# Patient Record
Sex: Male | Born: 1985
Health system: Southern US, Community
[De-identification: ages and names within clinical notes are randomized; demographics above are authoritative.]

## PROBLEM LIST (undated history)

## (undated) DIAGNOSIS — K921 Melena: Secondary | ICD-10-CM

## (undated) DIAGNOSIS — E785 Hyperlipidemia, unspecified: Secondary | ICD-10-CM

## (undated) HISTORY — DX: Hyperlipidemia, unspecified: E78.5

## (undated) HISTORY — DX: Melena: K92.1

## (undated) HISTORY — PX: WISDOM TOOTH EXTRACTION: SHX21

---

## 2007-07-04 ENCOUNTER — Encounter: Admission: RE | Admit: 2007-07-04 | Discharge: 2007-07-04 | Payer: Self-pay | Admitting: Internal Medicine

## 2008-11-08 ENCOUNTER — Emergency Department (HOSPITAL_COMMUNITY): Admission: EM | Admit: 2008-11-08 | Discharge: 2008-11-08 | Payer: Self-pay | Admitting: Family Medicine

## 2009-02-09 IMAGING — RF DG UGI W/ HIGH DENSITY W/KUB
11 of 14 series · 18 of 24 positions shown · non-contrast
Comparison: None.

CLINICAL DATA: Heartburn.  Bright red blood in stools.  Constipation.  Chest pain. 
 UPPER GI WITH HIGH DENSITY WITH KUB:

[Series 1: run · 1 of 1 slices shown (1 of 10)]
[im 1/1]
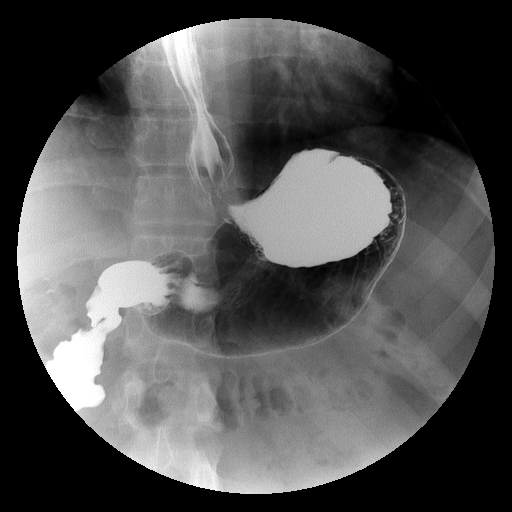

[Series 3: run · 4 of 12 slices shown (2 of 10)]
[im 1/12]
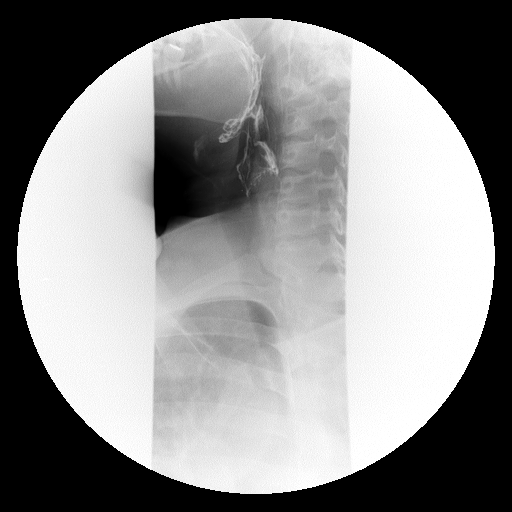
[im 3/12]
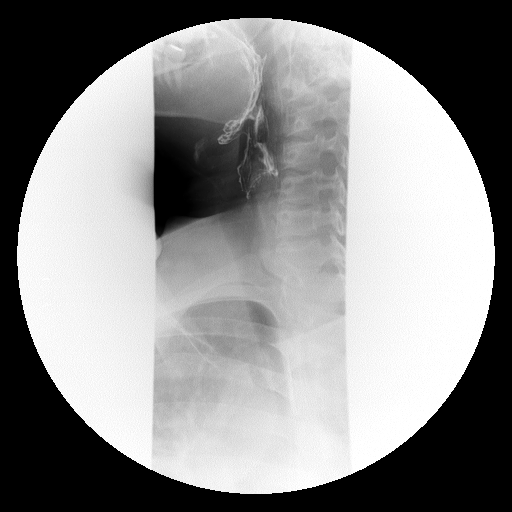
[im 6/12]
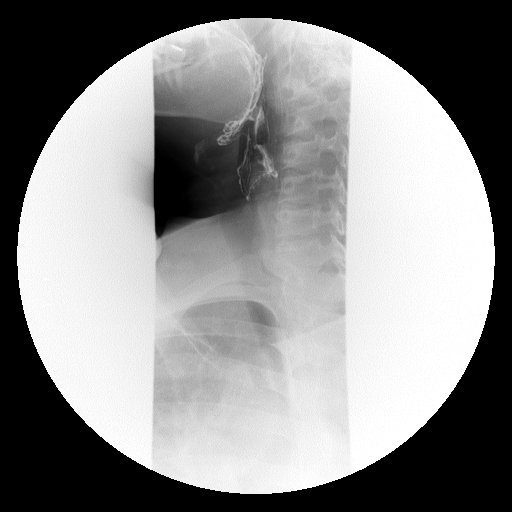
[im 12/12]
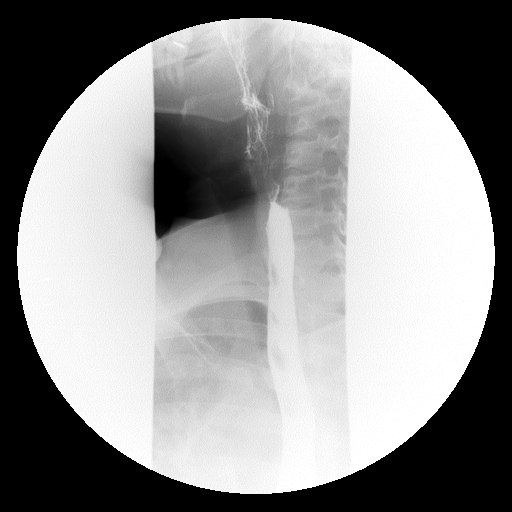

[Series 4: run · 1 of 3 slices shown (3 of 10)]
[im 1/3]
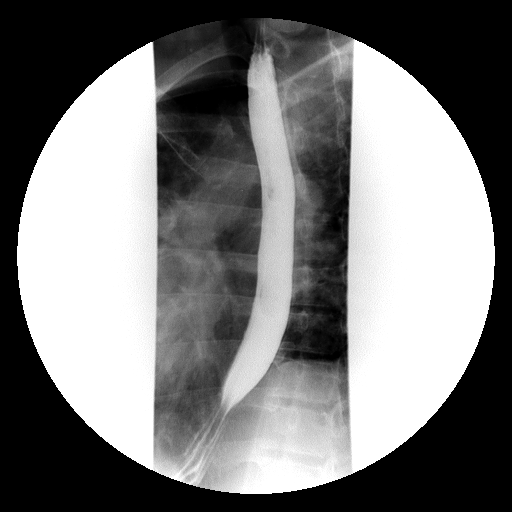

[Series 5: run · 5 of 15 slices shown (4 of 10)]
[im 1/15]
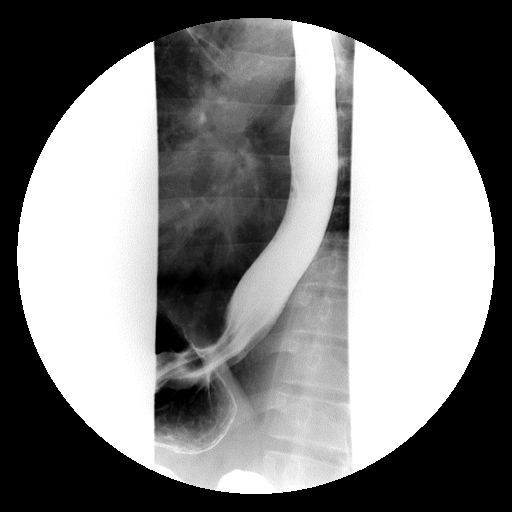
[im 5/15]
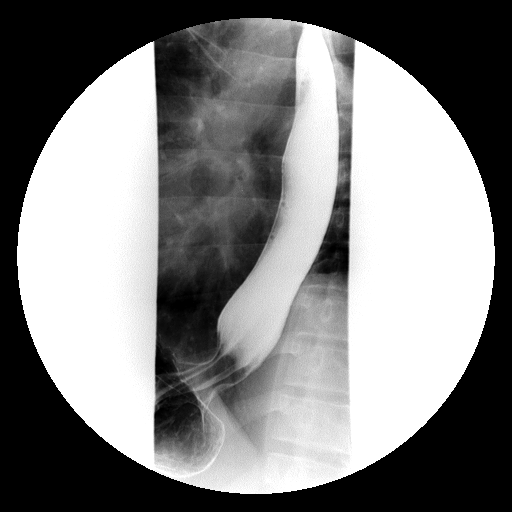
[im 8/15]
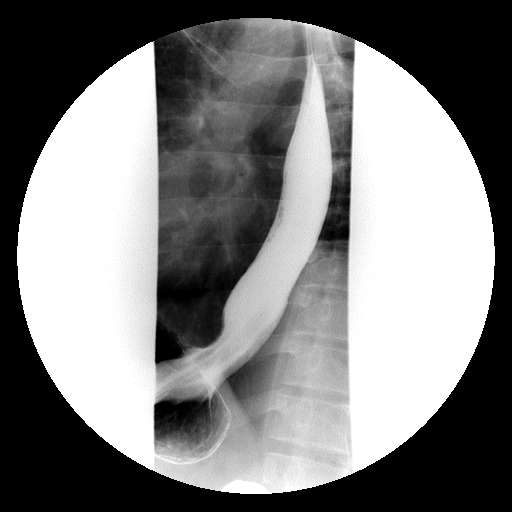
[im 10/15]
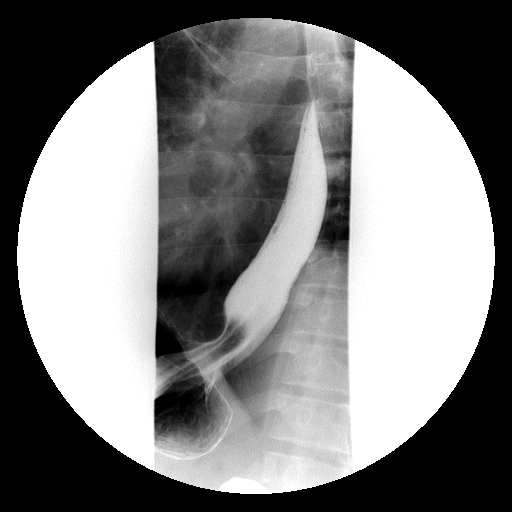
[im 15/15]
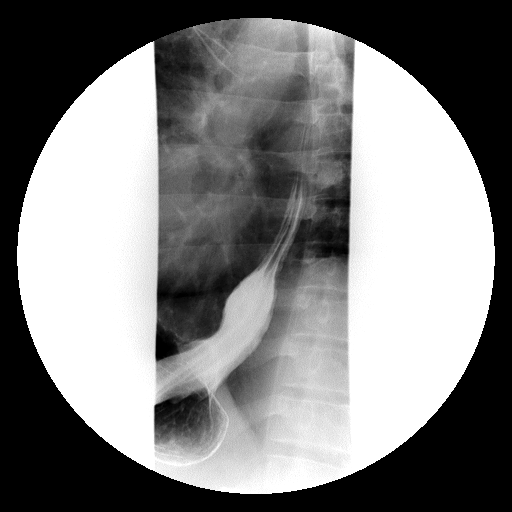

[Series 6: run · 1 of 1 slices shown (5 of 10)]
[im 1/1]
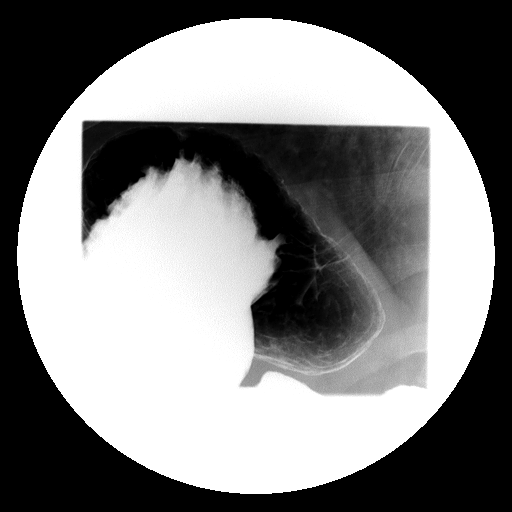

[Series 7: run · 1 of 1 slices shown (6 of 10)]
[im 1/1]
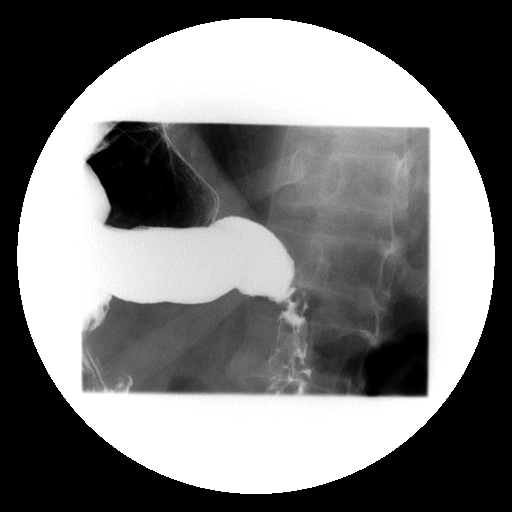

[Series 9: run · 1 of 1 slices shown (7 of 10)]
[im 1/1]
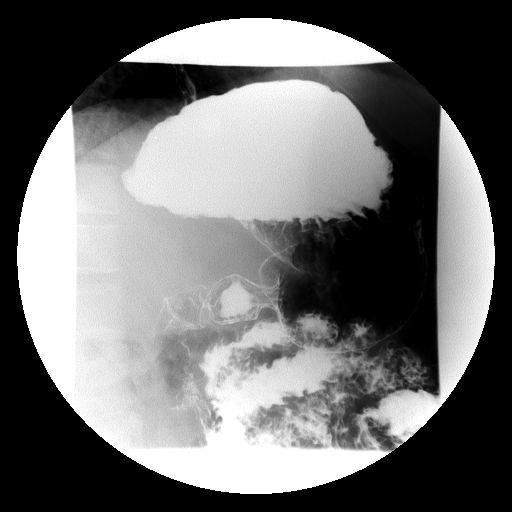

[Series 10: run · 1 of 1 slices shown (8 of 10)]
[im 1/1]
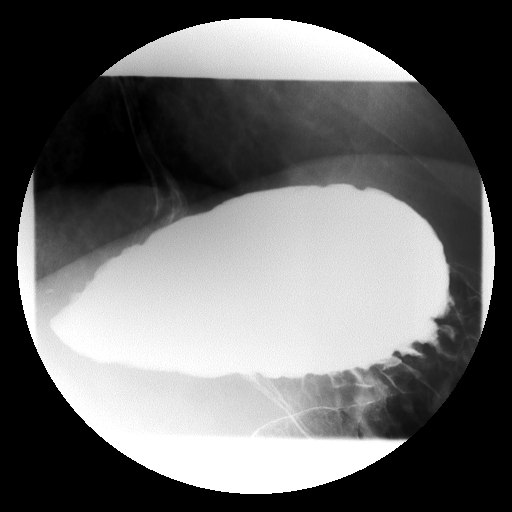

[Series 11: run · 1 of 1 slices shown (9 of 10)]
[im 1/1]
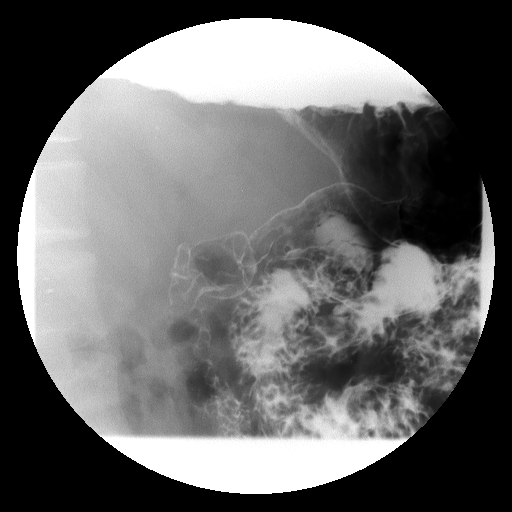

[Series 13: run · 1 of 1 slices shown (10 of 10)]
[im 1/1]
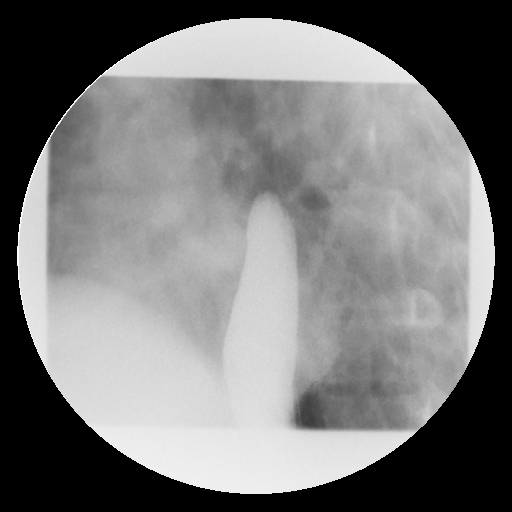

[Series 1001: view not recorded · 0.20mm/px · 1 of 1 slices shown]
[im 1/1]
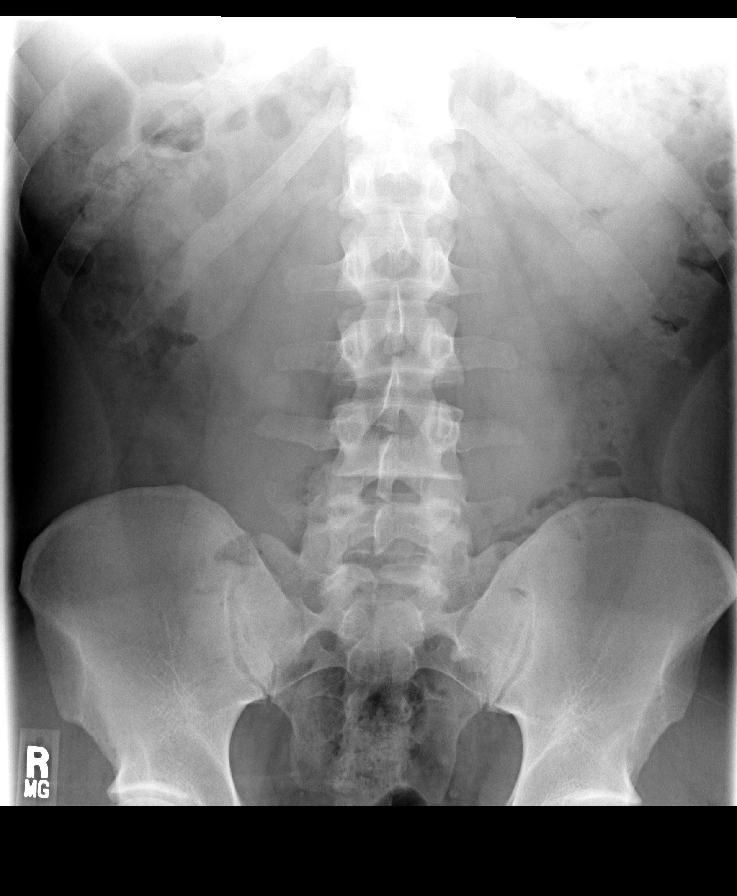

[18 of 24 positions shown; findings below may reference images not displayed]

FINDINGS: Normal antegrade peristalsis is seen through the cervical thoracic esophagus with very small sliding type hiatus hernia.  Distal esophageal longitudinal folds are thickened consistent with mild esophagitis without significant spasm.  Minimal spontaneous and slight water siphon induced gastroesophageal reflux was demonstrated.  No other intrinsic nor extrinsic esophageal lesions are seen.  Stomach, duodenum, and proximal loops of jejunum appear normal.
IMPRESSION: 1.  Minimal spontaneous and slight water siphon induced gastroesophageal reflux. 
 2.  Very small sliding type hiatus hernia with mild distal esophagitis findings.  
 3.  Otherwise negative.

## 2010-12-07 LAB — POCT URINALYSIS DIP (DEVICE)
Glucose, UA: NEGATIVE mg/dL
Hgb urine dipstick: NEGATIVE
Ketones, ur: NEGATIVE mg/dL
Nitrite: NEGATIVE
Protein, ur: NEGATIVE mg/dL
Specific Gravity, Urine: 1.02 (ref 1.005–1.030)

## 2010-12-07 LAB — POCT I-STAT, CHEM 8
BUN: 16 mg/dL (ref 6–23)
Calcium, Ion: 1.06 mmol/L — ABNORMAL LOW (ref 1.12–1.32)
Chloride: 104 mEq/L (ref 96–112)
Creatinine, Ser: 1 mg/dL (ref 0.4–1.5)
HCT: 50 % (ref 39.0–52.0)
Hemoglobin: 17 g/dL (ref 13.0–17.0)
Potassium: 3.9 mEq/L (ref 3.5–5.1)
Sodium: 140 mEq/L (ref 135–145)

## 2017-01-06 DIAGNOSIS — N453 Epididymo-orchitis: Secondary | ICD-10-CM | POA: Diagnosis not present

## 2017-03-29 LAB — HIV ANTIBODY (ROUTINE TESTING W REFLEX): HIV 1&2 Ab, 4th Generation: NEGATIVE

## 2017-03-29 LAB — HEMOGLOBIN A1C: HEMOGLOBIN A1C: 4.5

## 2017-03-29 LAB — BASIC METABOLIC PANEL: Glucose: 74

## 2017-04-04 ENCOUNTER — Encounter: Payer: Self-pay | Admitting: Family Medicine

## 2017-04-04 ENCOUNTER — Ambulatory Visit (INDEPENDENT_AMBULATORY_CARE_PROVIDER_SITE_OTHER): Payer: 59 | Admitting: Family Medicine

## 2017-04-04 VITALS — BP 104/76 | HR 87 | Temp 98.5°F | Ht 70.25 in | Wt 230.4 lb

## 2017-04-04 DIAGNOSIS — Z0001 Encounter for general adult medical examination with abnormal findings: Secondary | ICD-10-CM | POA: Diagnosis not present

## 2017-04-04 DIAGNOSIS — K921 Melena: Secondary | ICD-10-CM

## 2017-04-04 DIAGNOSIS — Z23 Encounter for immunization: Secondary | ICD-10-CM

## 2017-04-04 NOTE — Addendum Note (Signed)
Addended by: Rivka SaferSOUTHERN HIZER, Asher MuirJAMIE M on: 04/04/2017 02:13 PM   Modules accepted: Orders

## 2017-04-04 NOTE — Addendum Note (Signed)
Addended by: Nehemiah MassedAGBO, Jodeci Roarty D on: 04/04/2017 02:16 PM   Modules accepted: Orders

## 2017-04-04 NOTE — Progress Notes (Signed)
Phone: [phone]  Subjective:  Patient presents today to establish care.  Prior patient about 10 years ago in [place]. Chief complaint-noted.   See problem oriented charting  The following were reviewed and entered/updated in epic: Past Medical History:  Diagnosis Date  . Blood in stool    a few times a year. never evaluated.    Patient Active Problem List   Diagnosis Date Noted  . Blood in stool    Past Surgical History:  Procedure Laterality Date  . WISDOM TOOTH EXTRACTION     had anesthesia    Family History  Problem Relation Age of Onset  . Healthy Mother   . Depression Father        on rx  . Healthy Sister   . Healthy Brother   . Breast cancer Maternal Grandmother        late 50s  . Stroke Maternal Grandfather        died 67, smoker earlier in life  . Other Paternal Grandfather        hit by car    Medications- reviewed and updated No current outpatient prescriptions on file.   No current facility-administered medications for this visit.     Allergies-reviewed and updated Allergies  Allergen Reactions  . Penicillins Hives    Social History   Social History  . Marital status: Married    Spouse name: N/A  . Number of children: N/A  . Years of education: N/A   Social History Main Topics  . Smoking status: Never Smoker  . Smokeless tobacco: Never Used  . Alcohol use 3.0 oz/week    5 Standard drinks or equivalent per week  . Drug use: No  . Sexual activity: Yes   Other Topics Concern  . None   Social History Narrative   Married to Moshe Cipro, PT ([facility]) since 8072. 22 year old son- Monia Pouch - in 2018.       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    Robina Ade Pricilla Handler- studied Primary school teacher      Hobbies: football fan ([employer], some [facility] games), does some fantasy football, family time    ROS--Full ROS was  completed Review of Systems  Constitutional: Negative for chills and fever.  HENT: Negative for hearing loss and tinnitus.   Eyes: Negative for blurred vision and double vision.  Respiratory: Negative for cough and hemoptysis.   Cardiovascular: Negative for chest pain and palpitations.  Gastrointestinal: Negative for heartburn and nausea.  Genitourinary: Negative for dysuria and urgency.  Musculoskeletal: Positive for myalgias (left groin pain at times in AM). Negative for back pain and joint pain.  Skin: Negative for itching and rash.  Neurological: Negative for dizziness and headaches.  Endo/Heme/Allergies: Negative for polydipsia. Does not bruise/bleed easily.  Psychiatric/Behavioral: Negative for hallucinations and substance abuse.   Objective: BP 104/76 (BP Location: Left Arm, Patient Position: Sitting, Cuff Size: Large)   Pulse 87   Temp 98.5 F (36.9 C) (Oral)   Ht 5' 10.25" (1.784 m)   Wt 230 lb 6.4 oz (104.5 kg)   SpO2 95%   BMI 32.82 kg/m  Gen: NAD, resting comfortably HEENT: Mucous membranes are moist. Oropharynx normal. TM normal. Eyes: sclera and lids normal, PERRLA Neck: no thyromegaly, no cervical lymphadenopathy CV: RRR no murmurs rubs or gallops Lungs: CTAB no crackles, wheeze, rhonchi Abdomen: soft/nontender/nondistended/normal bowel sounds. No rebound or guarding.  Ext: no edema Skin:  warm, dry Neuro: 5/5 strength in upper and lower extremities, normal gait, normal reflexes Male genitalia: normal, penis: no lesions or discharge. testes: no masses or tenderness. no hernias, normal findings: scrotal contents normal to inspection and palpation, normal testes palpated bilaterally and no hernia detected  Assessment/Plan:  31 y.o. male presenting for annual physical.  Health Maintenance counseling: 1. Anticipatory guidance: Patient counseled regarding regular dental exams-q6 months, eye exams - annually- no issues, wearing seatbelts.  2. Risk factor reduction:   Advised patient of need for regular exercise and diet rich and fruits and vegetables to reduce risk of heart attack and stroke. Exercise- 0-1 x a week- advised 150 minutes a week. Diet-needs to cut down on carbs/pizza type foods/fried foods- twice a week eats out. Discussed goals 200 or so with hs 180-185.  Wt Readings from Last 3 Encounters:  04/04/17 230 lb 6.4 oz (104.5 kg)  3. Immunizations/screenings/ancillary studies- advised Tdap - given today. Flu shot in fall recommended.  4. Prostate cancer screening- no family history, start at age 33-55   5. Colon cancer screening - no family history, start at age 33-50. Will do stool cards due to rectal bleeding 6. Skin cancer screening/prevention- wears sunscreen regularly. No worrisome skin lesions- gets some small pimple like areas on arms that come and go 7. Testicular cancer screening- advised monthly self exams. Also see concerns below 8. STD screening- patient opts out- monogamous   Status of chronic or acute concerns   Waiting on semen analysis- trying for another child 6 months. This worries him in relation to next issue--> Tension and soreness in groin. Worse when spreads legs in morning. Often in the morning and in the left side - not in the testicle itself- but in left low groin. Plays softball- strained during softball- but not sure that caused it. 2 months ago testicle itself felt swollen on a long car ride to the beach. Was seen at urgent care next day- was placed on antibiotic and better within 24 hours. No recurrence  For a few years has had intermittent blood in the stools a few times a year. Will get stool cards. No family history colon cancer or polyps- if this developed would refer for colonoscopy. Would also refer if stool cards positive  Tdap given  Labs from work reviewed- no concerns other than LDL 114 (likely can improve that with exercise)  Orders Placed This Encounter  Procedures  . POC Hemoccult Bld/Stl (3-Cd Home  Screen)    Send home    Standing Status:   Future    Standing Expiration Date:   04/04/2018   Return precautions advised.  Tana ConchStephen Jayton Popelka, MD

## 2017-04-04 NOTE — Patient Instructions (Addendum)
Tdap today  I do not see any hernias in the groin and testicle and scrotum contents are completely normal. Based on your pain happening with position changes suspects musculoskeletal cause- I would have your wife take a look at your hip/groin- and could also refer to Dr. Berline Choughigby if needed.   I would love to see you exercising 150 minutes a week, getting 3-5 servings of veggies in a day, cutting down on fried foods and carbs and working on getting weight down to 200 lbs. This should help get bad cholesterol down under 100 (114 on labs you brought in- thanks for bringing these!)   Stop by lab to get stool cards and send those back/drop them off for us. If blood in stool or you end up finding about family history of colon cancer or polyps would move forward with GI referral and colonoscopy

## 2017-04-05 ENCOUNTER — Encounter: Payer: Self-pay | Admitting: Family Medicine

## 2019-03-04 ENCOUNTER — Ambulatory Visit (INDEPENDENT_AMBULATORY_CARE_PROVIDER_SITE_OTHER): Payer: 59 | Admitting: Physician Assistant

## 2019-03-04 ENCOUNTER — Encounter: Payer: Self-pay | Admitting: Physician Assistant

## 2019-03-04 ENCOUNTER — Telehealth: Payer: Self-pay | Admitting: Physician Assistant

## 2019-03-04 ENCOUNTER — Ambulatory Visit: Payer: Self-pay | Admitting: *Deleted

## 2019-03-04 DIAGNOSIS — R05 Cough: Secondary | ICD-10-CM

## 2019-03-04 DIAGNOSIS — Z20828 Contact with and (suspected) exposure to other viral communicable diseases: Secondary | ICD-10-CM

## 2019-03-04 DIAGNOSIS — Z7189 Other specified counseling: Secondary | ICD-10-CM

## 2019-03-04 DIAGNOSIS — R059 Cough, unspecified: Secondary | ICD-10-CM

## 2019-03-04 NOTE — Telephone Encounter (Signed)
Pt called in to schedule Covid-19 testing. Pt requests call back. Cb# [phone]

## 2019-03-04 NOTE — Telephone Encounter (Signed)
Please call patient and schedule COVID-19 testing at your earliest convenience.  Thank you, Lasheika Ortloff PA-C  

## 2019-03-04 NOTE — Telephone Encounter (Signed)
Pt call c/o sore throat and cough this morning. He would like to be tested for covid-19. Advised that he would need a virtual appointment first, he voiced understanding. He denies fever, shortness of breath, body aches or loss of smell and taste.  And he has not been around of anyone that has tested positive for the virus that he is aware of. [facility] notified regarding an appointment. Call transferred over to them. Triage encounter routed to the office for review. Reason for Disposition . [1] COVID-19 infection suspected by caller or triager AND [2] mild symptoms (cough, fever, or others) AND [3] no complications or SOB  Answer Assessment - Initial Assessment Questions 1. COVID-19 DIAGNOSIS: "Who made your Coronavirus (COVID-19) diagnosis?" "Was it confirmed by a positive lab test?" If not diagnosed by a HCP, ask "Are there lots of cases (community spread) where you live?" (See public health department website, if unsure)     In the community 2. ONSET: "When did the COVID-19 symptoms start?"      today 3. WORST SYMPTOM: "What is your worst symptom?" (e.g., cough, fever, shortness of breath, muscle aches)     Sore throat 4. COUGH: "Do you have a cough?" If so, ask: "How bad is the cough?"       yes 5. FEVER: "Do you have a fever?" If so, ask: "What is your temperature, how was it measured, and when did it start?"     no 6. RESPIRATORY STATUS: "Describe your breathing?" (e.g., shortness of breath, wheezing, unable to speak)      no 7. BETTER-SAME-WORSE: "Are you getting better, staying the same or getting worse compared to yesterday?"  If getting worse, ask, "In what way?"     Worst today 8. HIGH RISK DISEASE: "Do you have any chronic medical problems?" (e.g., asthma, heart or lung disease, weak immune system, etc.)     no 9. PREGNANCY: "Is there any chance you are pregnant?" "When was your last menstrual period?"     no 10. OTHER SYMPTOMS: "Do you have any other symptoms?"   (e.g., chills, fatigue, headache, loss of smell or taste, muscle pain, sore throat)       Sore throat  Protocols used: CORONAVIRUS (COVID-19) DIAGNOSED OR SUSPECTED-A-AH

## 2019-03-04 NOTE — Progress Notes (Signed)
Virtual Visit via Video   I connected with Spencer Phillips on 03/04/19 at  3:40 PM EDT by a video enabled telemedicine application and verified that I am speaking with the correct person using two identifiers. Location patient: Home Location provider: [facility], Office Persons participating in the virtual visit: Spencer Phillips, Jarold MottoSamantha Muhamed Luecke PA-C.  I discussed the limitations of evaluation and management by telemedicine and the availability of in person appointments. The patient expressed understanding and agreed to proceed.  I acted as a Neurosurgeonscribe for Energy East CorporationSamantha Kariel Skillman, Avon ProductsPA-C Donna Orphanos, LPN  Subjective:   HPI:   Cough Pt cough x 2 days, expectorating clear/yellow sputum. Also sore throat since last night. Denies, fever, body aches or diarrhea, no headaches. Pt took Claritin today and has been using throat lozenges with some relief. Pt has not been exposed to COVID-19 that he is aware of. Wife works in Teacher, musichealthcare.   ROS: See pertinent positives and negatives per HPI.  Patient Active Problem List   Diagnosis Date Noted  . Blood in stool     Social History   Tobacco Use  . Smoking status: Never Smoker  . Smokeless tobacco: Never Used  Substance Use Topics  . Alcohol use: Yes    Alcohol/week: 5.0 standard drinks    Types: 5 Standard drinks or equivalent per week   No current outpatient medications on file.  Allergies  Allergen Reactions  . Penicillins Hives    Objective:   VITALS: Per patient if applicable, see vitals. GENERAL: Alert, appears well and in no acute distress. HEENT: Atraumatic, conjunctiva clear, no obvious abnormalities on inspection of external nose and ears. NECK: Normal movements of the head and neck. CARDIOPULMONARY: No increased WOB. Speaking in clear sentences. I:E ratio WNL.  MS: Moves all visible extremities without noticeable abnormality. PSYCH: Pleasant and cooperative, well-groomed. Speech normal rate and rhythm.  Affect is appropriate. Insight and judgement are appropriate. Attention is focused, linear, and appropriate.  NEURO: CN grossly intact. Oriented as arrived to appointment on time with no prompting. Moves both UE equally.  SKIN: No obvious lesions, wounds, erythema, or cyanosis noted on face or hands.  Assessment and Plan:   Spencer Phillips was seen today for cough and sore throat.  Diagnoses and all orders for this visit:  Cough  Advice Given About Covid-19 Virus Infection   Patient has a respiratory illness without signs of acute distress or respiratory compromise at this time. This is likely a viral infection, which can come from a number of respiratory viruses. We are going to send patient for drive-up testing. As a precaution, they have been advised to remain home until COVID-19 results and then possible further quarantine after that based on results and symptoms. Advised if they experience a "second sickening" or worsening symptoms as the illness progresses, they are to call the office for further instructions or seek emergent evaluation for any severe symptoms.    . Reviewed expectations re: course of current medical issues. . Discussed self-management of symptoms. . Outlined signs and symptoms indicating need for more acute intervention. . Patient verbalized understanding and all questions were answered. Marland Kitchen. Health Maintenance issues including appropriate healthy diet, exercise, and smoking avoidance were discussed with patient. . See orders for this visit as documented in the electronic medical record.  I discussed the assessment and treatment plan with the patient. The patient was provided an opportunity to ask questions and all were answered. The patient agreed with the plan and demonstrated an understanding  of the instructions.   The patient was advised to call back or seek an in-person evaluation if the symptoms worsen or if the condition fails to improve as anticipated.   CMA or LPN  served as scribe during this visit. History, Physical, and Plan performed by medical provider. The above documentation has been reviewed and is accurate and complete.   [place], [place] 03/04/2019

## 2019-03-05 ENCOUNTER — Other Ambulatory Visit: Payer: Self-pay | Admitting: Internal Medicine

## 2019-03-05 ENCOUNTER — Other Ambulatory Visit: Payer: Self-pay

## 2019-03-05 DIAGNOSIS — Z20828 Contact with and (suspected) exposure to other viral communicable diseases: Secondary | ICD-10-CM

## 2019-03-05 DIAGNOSIS — Z20822 Contact with and (suspected) exposure to covid-19: Secondary | ICD-10-CM

## 2019-03-05 NOTE — Telephone Encounter (Signed)
Called pt and he states after not hearing anything, his wife who works for [employer] informed him to just go to [facility] testing site. Pt went around 8:15 this morning and was tested. He had no additional questions at this time. Nothing further is needed.

## 2019-03-09 LAB — NOVEL CORONAVIRUS, NAA: SARS-CoV-2, NAA: NOT DETECTED

## 2019-03-10 ENCOUNTER — Encounter: Payer: Self-pay | Admitting: Physician Assistant

## 2019-11-02 ENCOUNTER — Ambulatory Visit: Payer: Commercial Managed Care - PPO | Attending: Internal Medicine

## 2019-11-02 DIAGNOSIS — Z23 Encounter for immunization: Secondary | ICD-10-CM | POA: Insufficient documentation

## 2019-11-02 NOTE — Progress Notes (Signed)
   Covid-19 Vaccination Clinic  Name:  Spencer Phillips    MRN: [id number] DOB: Oct 22, 1985  11/02/2019  Mr. Spencer Phillips was observed post Covid-19 immunization for 15 minutes without incident. He was provided with Vaccine Information Sheet and instruction to access the V-Safe system.   Mr. Spencer Phillips was instructed to call 911 with any severe reactions post vaccine: Marland Kitchen Difficulty breathing  . Swelling of face and throat  . A fast heartbeat  . A bad rash all over body  . Dizziness and weakness   Immunizations Administered    Name Date Dose VIS Date Route   Pfizer COVID-19 Vaccine 11/02/2019 12:24 PM 0.3 mL 08/07/2019 Intramuscular   Manufacturer: [employer], [employer]   Lot: [id number]   NDC: [address]-4

## 2019-11-30 ENCOUNTER — Ambulatory Visit: Payer: Commercial Managed Care - PPO | Attending: Internal Medicine

## 2019-11-30 DIAGNOSIS — Z23 Encounter for immunization: Secondary | ICD-10-CM

## 2019-11-30 NOTE — Progress Notes (Signed)
   Covid-19 Vaccination Clinic  Name:  Spencer Phillips    MRN: [id number] DOB: 18-Jun-1986  11/30/2019  Mr. Spencer Phillips was observed post Covid-19 immunization for 15 minutes without incident. He was provided with Vaccine Information Sheet and instruction to access the V-Safe system.   Mr. Spencer Phillips was instructed to call 911 with any severe reactions post vaccine: Marland Kitchen Difficulty breathing  . Swelling of face and throat  . A fast heartbeat  . A bad rash all over body  . Dizziness and weakness   Immunizations Administered    Name Date Dose VIS Date Route   Pfizer COVID-19 Vaccine 11/30/2019  2:11 PM 0.3 mL 08/07/2019 Intramuscular   Manufacturer: [employer], [employer]   Lot: [id number]   NDC: [address]-0

## 2019-12-02 ENCOUNTER — Ambulatory Visit: Payer: Commercial Managed Care - PPO

## 2020-02-02 ENCOUNTER — Encounter: Payer: Self-pay | Admitting: Family Medicine

## 2020-02-02 ENCOUNTER — Other Ambulatory Visit: Payer: Self-pay

## 2020-02-02 DIAGNOSIS — Z3009 Encounter for other general counseling and advice on contraception: Secondary | ICD-10-CM

## 2020-03-02 ENCOUNTER — Other Ambulatory Visit: Payer: Self-pay

## 2020-03-02 ENCOUNTER — Encounter: Payer: Self-pay | Admitting: Physician Assistant

## 2020-03-02 ENCOUNTER — Ambulatory Visit (INDEPENDENT_AMBULATORY_CARE_PROVIDER_SITE_OTHER): Payer: Commercial Managed Care - PPO | Admitting: Physician Assistant

## 2020-03-02 VITALS — BP 110/78 | HR 80 | Temp 97.9°F | Ht 70.25 in | Wt 232.4 lb

## 2020-03-02 DIAGNOSIS — R21 Rash and other nonspecific skin eruption: Secondary | ICD-10-CM

## 2020-03-02 MED ORDER — VALACYCLOVIR HCL 1 G PO TABS: 1000.0000 mg | ORAL_TABLET | Freq: Three times a day (TID) | ORAL | 0 refills | Status: AC

## 2020-03-02 MED FILL — valACYclovir HCL 1 GM TABS: 1 | 7 days supply | Qty: 21 | Fill #0

## 2020-03-02 NOTE — Progress Notes (Signed)
Spencer Phillips is a 34 y.o. male here for a new problem.  I acted as a Neurosurgeon for [employer], PA-C Corky Mull, LPN   History of Present Illness:   Chief Complaint  Patient presents with  . Rash    HPI    Rash Pt c/o head sensitivity that started a week ago, "felt like a sunburn". Then he developed symptoms in his left ear on Thursday and Friday, with swollen glands in his neck. Sunday AM left eye started to bother him, pins and needles and then yesterday blisters popped up left eyebrow. Pt has taken Aleve and Advil no relief. He also took a spoonful of Amoxicillin in pudding trying to get daughter to take last week.  Denies: fevers, chills, malaise, sore throat, recent URI, prior known hx of shingles  Has the sensation of foreign body in his L eye.  Past Medical History:  Diagnosis Date  . Blood in stool    a few times a year. never evaluated.      Social History   Tobacco Use  . Smoking status: Never Smoker  . Smokeless tobacco: Never Used  Vaping Use  . Vaping Use: Never used  Substance Use Topics  . Alcohol use: Yes    Alcohol/week: 5.0 standard drinks    Types: 5 Standard drinks or equivalent per week  . Drug use: No    Past Surgical History:  Procedure Laterality Date  . WISDOM TOOTH EXTRACTION     had anesthesia    Family History  Problem Relation Age of Onset  . Healthy Mother   . Depression Father        on rx  . Healthy Sister   . Healthy Brother   . Breast cancer Maternal Grandmother        late 94s  . Stroke Maternal Grandfather        died 40, smoker earlier in life  . Other Paternal Grandfather        hit by car    Allergies  Allergen Reactions  . Penicillins Hives    Current Medications:   Current Outpatient Medications:  .  valACYclovir (VALTREX) 1000 MG tablet, Take 1 tablet (1,000 mg total) by mouth 3 (three) times daily for 7 days., Disp: 21 tablet, Rfl: 0   Review of Systems:   ROS  Negative unless  otherwise specified per HPI.  Vitals:   Vitals:   03/02/20 0848  BP: 110/78  Pulse: 80  Temp: 97.9 F (36.6 C)  TempSrc: Temporal  SpO2: 95%  Weight: 232 lb 6.1 oz (105.4 kg)  Height: 5' 10.25" (1.784 m)     Body mass index is 33.11 kg/m.  Physical Exam:   Physical Exam Vitals and nursing note reviewed.  Constitutional:      General: He is not in acute distress.    Appearance: He is well-developed. He is not ill-appearing or toxic-appearing.  HENT:     Head:   Eyes:     General: Lids are normal. Vision grossly intact.  Cardiovascular:     Rate and Rhythm: Normal rate and regular rhythm.     Pulses: Normal pulses.     Heart sounds: Normal heart sounds, S1 normal and S2 normal.     Comments: No LE edema Pulmonary:     Effort: Pulmonary effort is normal.     Breath sounds: Normal breath sounds.  Skin:    General: Skin is warm and dry.  Neurological:  Mental Status: He is alert.     GCS: GCS eye subscore is 4. GCS verbal subscore is 5. GCS motor subscore is 6.  Psychiatric:        Speech: Speech normal.        Behavior: Behavior normal. Behavior is cooperative.      Assessment and Plan:   Spencer Phillips was seen today for rash.  Diagnoses and all orders for this visit:  Rash and nonspecific skin eruption  Other orders -     valACYclovir (VALTREX) 1000 MG tablet; Take 1 tablet (1,000 mg total) by mouth 3 (three) times daily for 7 days.   Suspect shingles. He has an eye doctor and we discussed the need to reach out to them today for formal eye exam to ensure there are no other interventions needed. Patient verbalized understanding. Valtrex prescribed and shingles education provided. Follow-up as needed.  . Reviewed expectations re: course of current medical issues. . Discussed self-management of symptoms. . Outlined signs and symptoms indicating need for more acute intervention. . Patient verbalized understanding and all questions were answered. . See orders  for this visit as documented in the electronic medical record. . Patient received an After-Visit Summary.  CMA or LPN served as scribe during this visit. History, Physical, and Plan performed by medical provider. The above documentation has been reviewed and is accurate and complete.  Jarold Motto, PA-C

## 2020-03-02 NOTE — Patient Instructions (Signed)
It was great to see you!  Start your valtrex. Call your eye doctor to keep them in the loop, I suspect they will want to do an eye exam and make sure everything is looking okay.   Shingles Shingles, which is also known as herpes zoster, is an infection that causes a painful skin rash and fluid-filled blisters. It is caused by a virus. Shingles only develops in people who:  Have had chickenpox.  Have been given a medicine to protect against chickenpox (have been vaccinated). Shingles is rare in this group. What are the causes? Shingles is caused by varicella-zoster virus (VZV). This is the same virus that causes chickenpox. After a person is exposed to VZV, the virus stays in the body in an inactive (dormant) state. Shingles develops if the virus is reactivated. This can happen many years after the first (initial) exposure to VZV. It is not known what causes this virus to be reactivated. What increases the risk? People who have had chickenpox or received the chickenpox vaccine are at risk for shingles. Shingles infection is more common in people who:  Are older than age 64.  Have a weakened disease-fighting system (immune system), such as people with: ? HIV. ? AIDS. ? Cancer.  Are taking medicines that weaken the immune system, such as transplant medicines.  Are experiencing a lot of stress. What are the signs or symptoms? Early symptoms of this condition include itching, tingling, and pain in an area on your skin. Pain may be described as burning, stabbing, or throbbing. A few days or weeks after early symptoms start, a painful red rash appears. The rash is usually on one side of the body and has a band-like or belt-like pattern. The rash eventually turns into fluid-filled blisters that break open, change into scabs, and dry up in about 2-3 weeks. At any time during the infection, you may also develop:  A fever.  Chills.  A headache.  An upset stomach. How is this  diagnosed? This condition is diagnosed with a skin exam. Skin or fluid samples may be taken from the blisters before a diagnosis is made. These samples are examined under a microscope or sent to a lab for testing. How is this treated? The rash may last for several weeks. There is not a specific cure for this condition. Your health care provider will probably prescribe medicines to help you manage pain, recover more quickly, and avoid long-term problems. Medicines may include:  Antiviral drugs.  Anti-inflammatory drugs.  Pain medicines.  Anti-itching medicines (antihistamines). If the area involved is on your face, you may be referred to a specialist, such as an eye doctor (ophthalmologist) or an ear, nose, and throat (ENT) doctor (otolaryngologist) to help you avoid eye problems, chronic pain, or disability. Follow these instructions at home: Medicines  Take over-the-counter and prescription medicines only as told by your health care provider.  Apply an anti-itch cream or numbing cream to the affected area as told by your health care provider. Relieving itching and discomfort   Apply cold, wet cloths (cold compresses) to the area of the rash or blisters as told by your health care provider.  Cool baths can be soothing. Try adding baking soda or dry oatmeal to the water to reduce itching. Do not bathe in hot water. Blister and rash care  Keep your rash covered with a loose bandage (dressing). Wear loose-fitting clothing to help ease the pain of material rubbing against the rash.  Keep your rash and blisters  clean by washing the area with mild soap and cool water as told by your health care provider.  Check your rash every day for signs of infection. Check for: ? More redness, swelling, or pain. ? Fluid or blood. ? Warmth. ? Pus or a bad smell.  Do not scratch your rash or pick at your blisters. To help avoid scratching: ? Keep your fingernails clean and cut short. ? Wear gloves  or mittens while you sleep, if scratching is a problem. General instructions  Rest as told by your health care provider.  Keep all follow-up visits as told by your health care provider. This is important.  Wash your hands often with soap and water. If soap and water are not available, use hand sanitizer. Doing this lowers your chance of getting a bacterial skin infection.  Before your blisters change into scabs, your shingles infection can cause chickenpox in people who have never had it or have never been vaccinated against it. To prevent this from happening, avoid contact with other people, especially: ? Babies. ? Pregnant women. ? Children who have eczema. ? Elderly people who have transplants. ? People who have chronic illnesses, such as cancer or AIDS. Contact a health care provider if:  Your pain is not relieved with prescribed medicines.  Your pain does not get better after the rash heals.  You have signs of infection in the rash area, such as: ? More redness, swelling, or pain around the rash. ? Fluid or blood coming from the rash. ? The rash area feeling warm to the touch. ? Pus or a bad smell coming from the rash. Get help right away if:  The rash is on your face or nose.  You have facial pain, pain around your eye area, or loss of feeling on one side of your face.  You have difficulty seeing.  You have ear pain or have ringing in your ear.  You have a loss of taste.  Your condition gets worse. Summary  Shingles, which is also known as herpes zoster, is an infection that causes a painful skin rash and fluid-filled blisters.  This condition is diagnosed with a skin exam. Skin or fluid samples may be taken from the blisters and examined before the diagnosis is made.  Keep your rash covered with a loose bandage (dressing). Wear loose-fitting clothing to help ease the pain of material rubbing against the rash.  Before your blisters change into scabs, your shingles  infection can cause chickenpox in people who have never had it or have never been vaccinated against it. This information is not intended to replace advice given to you by your health care provider. Make sure you discuss any questions you have with your health care provider. Document Revised: 12/05/2018 Document Reviewed: 04/17/2017 Elsevier Patient Education  2020 [employer].

## 2020-04-07 MED FILL — HYDROCODON-APAP 5-325: 5-325 | 2 days supply | Qty: 6 | Fill #0

## 2020-04-07 MED FILL — DIAZEPAM 10 MG TABS: 10 | 1 days supply | Qty: 1 | Fill #0

## 2020-04-21 MED FILL — DIAZEPAM 10 MG TABS: 10 | 1 days supply | Qty: 1 | Fill #0

## 2020-06-09 MED FILL — HYDROCODON-APAP 5-325: 5-325 | 2 days supply | Qty: 6 | Fill #0

## 2022-02-15 ENCOUNTER — Telehealth: Payer: Commercial Managed Care - PPO | Admitting: Physician Assistant

## 2022-02-15 ENCOUNTER — Other Ambulatory Visit (HOSPITAL_BASED_OUTPATIENT_CLINIC_OR_DEPARTMENT_OTHER): Payer: Self-pay

## 2022-02-15 DIAGNOSIS — R21 Rash and other nonspecific skin eruption: Secondary | ICD-10-CM

## 2022-02-15 MED ORDER — GABAPENTIN 300 MG PO CAPS
300.0000 mg | ORAL_CAPSULE | Freq: Three times a day (TID) | ORAL | 0 refills | Status: DC
  Filled 2022-02-15: qty 30, 10d supply, fill #0

## 2022-02-15 MED ORDER — VALACYCLOVIR HCL 1 G PO TABS
1000.0000 mg | ORAL_TABLET | Freq: Three times a day (TID) | ORAL | 0 refills | Status: AC
  Filled 2022-02-15: qty 21, 7d supply, fill #0

## 2022-02-15 NOTE — Progress Notes (Signed)
E-visit for Shingles   We are sorry that you are not feeling well. Here is how we plan to help!  Based on what you shared with me it looks like you have shingles.  Shingles or herpes zoster, is a common infection of the nerves.  It is a painful rash caused by the herpes zoster virus.  This is the same virus that causes chickenpox.  After a person has chickenpox, the virus remains inactive in the nerve cells.  Years later, the virus can become active again and travel to the skin.  It typically will appear on one side of the face or body.  Burning or shooting pain, tingling, or itching are early signs of the infection.  Blisters typically scab over in 7 to 10 days and clear up within 2-4 weeks. Shingles is only contagious to people that have never had the chickenpox, the chickenpox vaccine, or anyone who has a compromised immune system.  You should avoid contact with these type of people until your blisters scab over.  I have prescribed Valacyclovir 1g three times daily for 7 days and also Gabapentin 300mg twice daily as needed for pain   HOME CARE: . Apply ice packs (wrapped in a thin towel), cool compresses, or soak in cool bath to help reduce pain. . Use calamine lotion to calm itchy skin. . Avoid scratching the rash. . Avoid direct sunlight.  GET HELP RIGHT AWAY IF: . Symptoms that don't away after treatment. . A rash or blisters near your eye. . Increased drainage, fever, or rash after treatment. . Severe pain that doesn't go away.   MAKE SURE YOU    Understand these instructions.  Will watch your condition.  Will get help right away if you are not doing well or get worse.  Thank you for choosing an e-visit. Your e-visit answers were reviewed by a board certified advanced clinical practitioner to complete your personal care plan. Depending upon the condition, your plan could have included both over the counter or prescription medications.  Please review your pharmacy choice.  Make sure the pharmacy is open so you can pick up prescription now. If there is a problem, you may contact your provider through MyChart messaging and have the prescription routed to another pharmacy.  Your safety is important to us. If you have drug allergies check your prescription carefully.   For the next 24 hours you can use MyChart to ask questions about today's visit, request a non-urgent call back, or ask for a work or school excuse.  You will get an email in the next two days asking about your experience. I hope that your e-visit has been valuable and will speed your recovery  

## 2022-02-15 NOTE — Progress Notes (Signed)
I have spent 5 minutes in review of e-visit questionnaire, review and updating patient chart, medical decision making and response to patient.   Phares Zaccone Cody Timber Marshman, PA-C    

## 2022-05-21 ENCOUNTER — Encounter: Payer: Self-pay | Admitting: *Deleted

## 2023-02-07 ENCOUNTER — Ambulatory Visit (INDEPENDENT_AMBULATORY_CARE_PROVIDER_SITE_OTHER): Payer: Commercial Managed Care - PPO | Admitting: Internal Medicine

## 2023-02-07 ENCOUNTER — Encounter: Payer: Self-pay | Admitting: Internal Medicine

## 2023-02-07 VITALS — BP 110/82 | HR 76 | Temp 98.1°F | Ht 70.25 in | Wt 234.6 lb

## 2023-02-07 DIAGNOSIS — K4090 Unilateral inguinal hernia, without obstruction or gangrene, not specified as recurrent: Secondary | ICD-10-CM

## 2023-02-07 DIAGNOSIS — R590 Localized enlarged lymph nodes: Secondary | ICD-10-CM

## 2023-02-07 DIAGNOSIS — R599 Enlarged lymph nodes, unspecified: Secondary | ICD-10-CM

## 2023-02-07 DIAGNOSIS — L989 Disorder of the skin and subcutaneous tissue, unspecified: Secondary | ICD-10-CM | POA: Diagnosis not present

## 2023-02-07 DIAGNOSIS — E65 Localized adiposity: Secondary | ICD-10-CM

## 2023-02-07 DIAGNOSIS — K921 Melena: Secondary | ICD-10-CM

## 2023-02-07 DIAGNOSIS — K625 Hemorrhage of anus and rectum: Secondary | ICD-10-CM | POA: Insufficient documentation

## 2023-02-07 HISTORY — DX: Localized enlarged lymph nodes: R59.0

## 2023-02-07 LAB — CBC
HCT: 46.1 % (ref 39.0–52.0)
Hemoglobin: 15.6 g/dL (ref 13.0–17.0)
MCHC: 33.8 g/dL (ref 30.0–36.0)
MCV: 87.9 fl (ref 78.0–100.0)
Platelets: 317 10*3/uL (ref 150.0–400.0)
RBC: 5.25 Mil/uL (ref 4.22–5.81)
RDW: 13.7 % (ref 11.5–15.5)
WBC: 8.6 10*3/uL (ref 4.0–10.5)

## 2023-02-07 LAB — COMPREHENSIVE METABOLIC PANEL
ALT: 24 U/L (ref 0–53)
AST: 18 U/L (ref 0–37)
Albumin: 4.6 g/dL (ref 3.5–5.2)
Alkaline Phosphatase: 79 U/L (ref 39–117)
BUN: 15 mg/dL (ref 6–23)
CO2: 27 mEq/L (ref 19–32)
Calcium: 9.3 mg/dL (ref 8.4–10.5)
Chloride: 103 mEq/L (ref 96–112)
Creatinine, Ser: 0.84 mg/dL (ref 0.40–1.50)
GFR: 110.08 mL/min (ref >60.00)
Glucose, Bld: 86 mg/dL (ref 70–99)
Potassium: 4.1 mEq/L (ref 3.5–5.1)
Sodium: 139 mEq/L (ref 135–145)
Total Bilirubin: 0.7 mg/dL (ref 0.2–1.2)
Total Protein: 6.9 g/dL (ref 6.0–8.3)

## 2023-02-07 LAB — LIPID PANEL
Cholesterol: 188 mg/dL (ref 0–200)
HDL: 44.7 mg/dL (ref >39.00)
LDL Cholesterol: 128 mg/dL — ABNORMAL HIGH (ref 0–99)
NonHDL: 143.79
Total CHOL/HDL Ratio: 4
Triglycerides: 79 mg/dL (ref 0.0–149.0)
VLDL: 15.8 mg/dL (ref 0.0–40.0)

## 2023-02-07 LAB — TSH: TSH: 1.4 u[IU]/mL (ref 0.35–5.50)

## 2023-02-07 LAB — HEMOGLOBIN A1C: Hgb A1c MFr Bld: 4.8 % (ref 4.6–6.5)

## 2023-02-07 NOTE — Assessment & Plan Note (Addendum)
Not sure what it is. Will referred care to derm   Benign Skin Lesion: He has a recurrent lesion on his forearm, described as similar to acne, which is occasionally tender and may persist for several weeks if manipulated. We will refer him to dermatology for evaluation and possible biopsy.

## 2023-02-07 NOTE — Assessment & Plan Note (Signed)
Lymphadenopathy: Noted in both axillae and possibly in the groin, the nodes are intermittently tender and have been present for approximately a year without a known history of malignancy. We will order an ultrasound of the axillae to confirm the presence of lymph nodes, a CT scan of the abdomen and pelvis to evaluate for possible lymphadenopathy in the groin and to rule out intra-abdominal causes, a chest x-ray to rule out thoracic lymphadenopathy, and complete blood count, thyroid function tests, and A1C to evaluate for systemic causes of lymphadenopathy.

## 2023-02-07 NOTE — Patient Instructions (Addendum)
VISIT SUMMARY:  During your visit, we discussed several health concerns including a skin lesion on your forearm, swollen lymph nodes in your armpits and groin, occasional rectal bleeding, and a possible hernia in your groin. We have planned several tests and referrals to specialists to further investigate these issues.  You will need to check MyChart, answer your phone, and get a chest xray at [facility] at [address] [facility]  YOUR PLAN:  -SWOLLEN LYMPH NODES: Swollen lymph nodes can be a sign of infection or other medical conditions. We will conduct an ultrasound of your armpits, a CT scan of your abdomen and pelvis, a chest x-ray, and blood tests to understand the cause of this swelling.  -POSSIBLE HERNIA: A hernia occurs when an organ pushes through an opening in the muscle or tissue that holds it in place. We will include your groin in the CT scan to confirm if there is a hernia and to understand its extent.  -RECTAL BLEEDING: Rectal bleeding can be a symptom of hemorrhoids or other conditions like colorectal cancer. We will refer you for a colonoscopy to identify the source of the bleeding.  -SKIN LESION: Skin lesions can be a sign of various skin conditions. We will refer you to a dermatologist for further evaluation and possible biopsy of the lesion on your forearm.  INSTRUCTIONS:  Please schedule the ultrasound, CT scan, chest x-ray, and blood tests as soon as possible. Also, make appointments with a gastroenterologist for the colonoscopy and a dermatologist for the skin lesion evaluation. Please continue to monitor your symptoms and report any changes or concerns.     Welcome aboard!   Today's visit was a valuable first step in understanding your health and starting your personalized care journey. We discussed your medical history and medications in detail. Given the extensive information, we prioritized addressing your most pressing concerns.  We understood those concerns to be:  New  Patient (Initial Visit), Inguinal Hernia (For a few years, more bothersome at times.), Bumps (For about a year- sore in arm pit area, forearms and sometimes knuckles (not currently). Not resolved and more sensitive at times.), and Annual Exam   Building a Complete Picture  To create the most effective care plan possible, we may need additional information from previous providers. We encouraged you to gather any relevant medical records for your next visit. This will help [place] build a more complete picture and develop a personalized plan together. In the meantime, we'll address your immediate concerns and provide resources to help you manage all of your medical issues.  We encourage you to use MyChart to review these efforts, and to help [place] find and correct any omissions or errors in your medical chart.  Managing Your Health Over Time  Managing every aspect of your health in a single visit isn't always feasible, but that's okay.  We addressed your most pressing concerns today and charted a course for future care. Acute conditions or preventive care measures may require further attention.  We encourage you to schedule a follow-up visit at your earliest convenience to discuss any unresolved issues.  We strongly encourage participation in annual preventive care visits to help [place] develop a more thorough understanding of your health and to help you maintain optimal wellness - please inquire about scheduling your next one with [place] at your earliest convenience.  Your Satisfaction Matters  It was a pleasure seeing you today!  Your health and satisfaction will always be my top priorities. If you believe  your experience today was worthy of a 5-star rating, I'd be grateful for your feedback!  Lula Olszewski, MD   Next Steps  Schedule Follow-Up:  We recommend a follow-up appointment in No follow-ups on file. If your condition worsens before then, please call us or seek emergency care. Preventive Care:  Don't  forget to schedule your annual preventive care visit!  This important checkup is typically covered by insurance and helps identify potential health issues early.  Typically its 100% insurance covered with no co-pay and helps to get surveillance labwork paid for through your insurance provider.  Sometimes it even lowers your insurance premiums to participate. Medical Information Release:  For any relevant medical information we don't have, please sign a release form so we can obtain it for your records. Lab & X-ray Appointments:  Scheduled any incomplete lab tests today or call us to schedule.  X-Rays can be done without an appointment at [facility] at [facility] ([address]), M-F 8:30am-noon or 1pm-5pm.  Just tell them you're there for X-rays ordered by Dr. Jon Billings.  We'll receive the results and contact you by phone or MyChart to discuss next steps.  Bring to Your Next Appointment  Medications: Please bring all your medication bottles to your next appointment to ensure we have an accurate record of your prescriptions. Health Diaries: If you're monitoring any health conditions at home, keeping a diary of your readings can be very helpful for discussions at your next appointment.  Reviewing Your Records  Please Review this early draft of your clinical notes below and the final encounter summary tomorrow on MyChart after its been completed.   Benign skin lesion of forearm Assessment & Plan: Not sure what it is. Will referred care to derm    BRBPR (bright red blood per rectum)  Blood in stool  Enlarged lymph nodes in armpit  Truncal obesity  Enlarged lymph nodes  Hernia, inguinal, right     Getting Answers and Following Up  Simple Questions & Concerns: For quick questions or basic follow-up after your visit, reach [place] at [phone] or MyChart messaging. Complex Concerns: If your concern is more complex, scheduling an appointment might be best. Discuss this with  the staff to find the most suitable option. Lab & Imaging Results: We'll contact you directly if results are abnormal or you don't use MyChart. Most normal results will be on MyChart within 2-3 business days, with a review message from Dr. Jon Billings. Haven't heard back in 2 weeks? Need results sooner? Contact us at [phone]. Referrals: Our referral coordinator will manage specialist referrals. The specialist's office should contact you within 2 weeks to schedule an appointment. Call us if you haven't heard from them after 2 weeks.  Staying Connected  MyChart: Activate your MyChart for the fastest way to access results and message [place]. See the last page of this paperwork for instructions.  Billing  X-ray & Lab Orders: These are billed by separate companies. Contact the invoicing company directly for questions or concerns. Visit Charges: Discuss any billing inquiries with our administrative services team.  Feedback & Satisfaction  Share Your Experience: We strive for your satisfaction! If you have any complaints, please let Dr. Jon Billings know directly or contact our Practice Administrators, Edwena Felty or [employer], by asking at the front desk.  Scheduling Tips  Shorter Wait Times: 8 am and 1 pm appointments often have the quickest wait times. Longer Appointments: If you need more time during your  visit, talk to the front desk. Due to insurance regulations, multiple back-to-back appointments might be necessary.

## 2023-02-07 NOTE — Assessment & Plan Note (Signed)
  Rectal Bleeding: He reports occasional rectal bleeding, attributed to hemorrhoids, and has been managing the discomfort with over-the-counter wipes. We will refer him for a colonoscopy to evaluate for possible sources of bleeding, including hemorrhoids and colorectal cancer.

## 2023-02-07 NOTE — Progress Notes (Signed)
[facility]  Phone: [phone]  New patient visit  Visit Date: 02/07/2023 Patient: Spencer Phillips   DOB: [birth year]   37 y.o. Male  MRN: [id number] PCP:  Lula Olszewski, MD  (establishing care today)  Today's Health Care Provider: Lula Olszewski, MD   Assessment and Plan:    Spencer Phillips was seen today for new patient (initial visit), inguinal hernia, bumps and annual exam.  Enlarged lymph nodes in armpit Overview: Really noticed in the past year Flares sometimes, other times feels like nothing and not very sensitive. Other times its hard to even put his arm down its so sensitive.  Mostly in past year, prior to that he would occasionally feel something in the axilla.  For the past year its been both arm pits  Thought it might be shingles because he had shingles episode a year prior but the medication(s) he took for shingles took care.    Assessment & Plan: Lymphadenopathy: Noted in both axillae and possibly in the groin, the nodes are intermittently tender and have been present for approximately a year without a known history of malignancy. We will order an ultrasound of the axillae to confirm the presence of lymph nodes, a CT scan of the abdomen and pelvis to evaluate for possible lymphadenopathy in the groin and to rule out intra-abdominal causes, a chest x-ray to rule out thoracic lymphadenopathy, and complete blood count, thyroid function tests, and A1C to evaluate for systemic causes of lymphadenopathy.  Orders: -     CBC -     Comprehensive metabolic panel -     DG Chest 2 View; Future -     [place] AXILLA LTD UPPER BILATERAL; Future  Benign skin lesion of forearm Overview: Photographs Taken 02/07/2023 :   Come and go, stay for a few weeks  Assessment & Plan: Not sure what it is. Will referred care to derm   Benign Skin Lesion: He has a recurrent lesion on his forearm, described as similar to acne, which is occasionally tender and may persist  for several weeks if manipulated. We will refer him to dermatology for evaluation and possible biopsy.      Orders: -     Ambulatory referral to Dermatology  BRBPR (bright red blood per rectum) Overview: >>OVERVIEW FOR BLOOD IN STOOL WRITTEN ON 02/07/2023 11:36 AM BY Lula Olszewski, MD  a few times a year. never evaluated. Believes it to be hemorrhoid. Never treated. Sometimes uncomfortable.    Assessment & Plan:  Rectal Bleeding: He reports occasional rectal bleeding, attributed to hemorrhoids, and has been managing the discomfort with over-the-counter wipes. We will refer him for a colonoscopy to evaluate for possible sources of bleeding, including hemorrhoids and colorectal cancer.  Orders: -     CBC -     Comprehensive metabolic panel -     Ambulatory referral to Gastroenterology  Blood in stool  Truncal obesity -     Lipid panel -     TSH -     Hemoglobin A1c  Enlarged lymph nodes -     CT ABDOMEN PELVIS W CONTRAST; Future  Hernia, inguinal, right Overview: Pain and pressure, sometimes very severe on right ? If might be lymphadenopathy.   Inguinal Hernia: He reports a lump in the right groin, possibly bilateral, with associated discomfort, especially when lifting heavy objects, present for 2-3 years. The groin will be included in the CT scan of the abdomen and pelvis to confirm the presence of  a hernia and to evaluate its extent.               Clinical Presentation:    Patient affirms intent to establish a primary care provider relationship with Lula Olszewski, MD going forward.  37 y.o. male  with main concerns (chief complaints) today expressed as New Patient (Initial Visit), Inguinal Hernia (For a few years, more bothersome at times.), Bumps (For about a year- sore in arm pit area, forearms and sometimes knuckles (not currently). Not resolved and more sensitive at times.), and Annual Exam   Discussed the use of AI scribe software for clinical note  transcription with the patient, who gave verbal consent to proceed.  History of Present Illness   The patient, a 37 year old with a history of shingles, presents with multiple concerns. He reports a benign skin lesion on the forearm, which has been present for an unspecified duration. The lesion, initially thought to be acne, is occasionally sensitive and persistent, leading to scarring.  Additionally, the patient has noticed lymphadenopathy in both axillae within the last year. The lymph nodes intermittently become sensitive and enlarged, causing discomfort. He also reports similar lumps in the groin area, which have been present for approximately two to three years. These lumps cause occasional discomfort, particularly during heavy lifting or straining.  Furthermore, the patient has experienced episodes of rectal bleeding, which he attributes to hemorrhoids. The bleeding is not associated with any significant discomfort, and he has been managing the symptoms with over-the-counter remedies.  Lastly, the patient mentions a possible hernia in the right inguinal region. This has been a mild concern for the past two to three years, with occasional discomfort and pressure noted, particularly during heavy lifting. He also reports occasional similar discomfort in the left inguinal region.        Comprehensive chart development today: Problems:has Benign skin lesion of forearm; Enlarged lymph nodes in armpit; Hernia, inguinal, right; BRBPR (bright red blood per rectum); and Truncal obesity on their problem list. No outpatient medications have been marked as taking for the 02/07/23 encounter (Office Visit) with Lula Olszewski, MD.   Allergies:  reports current alcohol use of about 5.0 standard drinks of alcohol per week. Past Medical History  has a past medical history of Blood in stool. Past Surgical History  has a past surgical history that includes Wisdom tooth extraction. Social History  reports  that he has never smoked. He has never used smokeless tobacco. He reports current alcohol use of about 5.0 standard drinks of alcohol per week. He reports that he does not use drugs. Family History family history includes ADD / ADHD in his son; Breast cancer in his maternal grandmother; Depression in his father; Healthy in his brother, mother, and sister; Other in his paternal grandfather; Stroke in his maternal grandfather.  Depression Screen and Health Maintenance:    02/07/2023   11:06 AM 04/04/2017    1:11 PM  PHQ 2/9 Scores  PHQ - 2 Score 0 0   Health Maintenance  Topic Date Due   COVID-19 Vaccine (3 - 2023-24 season) 08/16/2023 (Originally 04/27/2022)   INFLUENZA VACCINE  03/28/2023   DTaP/Tdap/Td (2 - Td or Tdap) 04/05/2027   HIV Screening  Completed   HPV VACCINES  Aged Out   Hepatitis C Screening  Discontinued   Immunization History  Administered Date(s) Administered   PFIZER(Purple Top)SARS-COV-2 Vaccination 11/02/2019, 11/30/2019   Tdap 04/04/2017        Objective:  Physical Exam  BP  110/82 (BP Location: Left Arm, Patient Position: Sitting)   Pulse 76   Temp 98.1 F (36.7 C) (Temporal)   Ht 5' 10.25" (1.784 m)   Wt 234 lb 9.6 oz (106.4 kg)   SpO2 94%   BMI 33.42 kg/m  Wt Readings from Last 10 Encounters:  02/07/23 234 lb 9.6 oz (106.4 kg)  03/02/20 232 lb 6.1 oz (105.4 kg)  04/04/17 230 lb 6.4 oz (104.5 kg)   Vital signs reviewed.  Nursing notes reviewed. Weight trend reviewed. Abnormalities and problem-specific physical exam findings:  large LAD bilateral axilla, skin lesion as shown on forearm problem overview. General Appearance:  Well developed, well nourished, well-groomed, healthy-appearing male with Body mass index is 33.42 kg/m. No acute distress appreciable.   Skin: Clear and well-hydrated. Pulmonary:  Normal work of breathing at rest, no respiratory distress apparent. SpO2: 94 %  Musculoskeletal: He demonstrates smooth and coordinated movements  throughout all major joints.All extremities are intact.  Neurological:  Awake, alert, oriented, and engaged.  No obvious focal neurological deficits or cognitive impairments.  Sensorium seems unclouded.  Psychiatric:  Appropriate mood, pleasant and cooperative demeanor, cheerful and engaged during the exam  Reviewed Results    Results for orders placed or performed in visit on 03/05/19  Novel Coronavirus, NAA ([facility])  Drive up testing site only  Result Value Ref Range   SARS-CoV-2, NAA Not Detected Not Detected    No results found for any visits on 02/07/23.  No results found for this or any previous visit (from the past 2160 hour(s)).  No image results found.   No results found.      Additional notes: Initial Appointment Goals:  This initial visit focused on establishing a foundation for the patient's care. We collaboratively reviewed his medical history and medications in detail, updating the chart as shown in the encounter. Given the extensive information, we prioritized addressing his most pressing concerns, which he reported were: New Patient (Initial Visit), Inguinal Hernia (For a few years, more bothersome at times.), Bumps (For about a year- sore in arm pit area, forearms and sometimes knuckles (not currently). Not resolved and more sensitive at times.), and Annual Exam  While the complexity of the patient's medical picture may necessitate further evaluation in subsequent visits, we were able to develop a preliminary care plan together. To expedite a comprehensive plan at the next visit, we encouraged the patient to gather relevant medical records from previous providers. This collaborative approach will ensure a more complete understanding of the patient's health and inform the development of a personalized care plan. We look forward to continuing the conversation and working together with the patient on achieving his health goals.   Collaborative Documentation:  Today's encounter  utilized real-time, dynamic patient engagement.  Patients actively participate by directly reviewing and assisting in updating their medical records through a shared screen. This transparency empowers patients to visually confirm chart updates made by the healthcare provider.  This collaborative approach facilitates problem management as we jointly update the problem list, problem overview, and assessment/plan. Ultimately, this process enhances chart accuracy and completeness, fostering shared decision-making, patient education, and informed consent for tests and treatments.  Collaborative Treatment Planning:  Treatment plans were discussed and reviewed in detail.  Explained medication safety and potential side effects.  Encouraged participation and answered all patient questions, confirming understanding and comfort with the plan. Encouraged patient to contact our office if they have any questions or concerns. Agreed on patient returning to office if symptoms  worsen, persist, or new symptoms develop. Discussed precautions in case of needing to visit the Emergency Department.  ----------------------------------------------------- Lula Olszewski, MD  02/07/2023 12:59 PM  [facility]:  [phone]

## 2023-02-09 ENCOUNTER — Encounter: Payer: Self-pay | Admitting: Internal Medicine

## 2023-02-09 DIAGNOSIS — E785 Hyperlipidemia, unspecified: Secondary | ICD-10-CM | POA: Insufficient documentation

## 2023-02-09 NOTE — Progress Notes (Signed)
I've reviewed the results and they are all essentially normal, except for cholesterol levels.  LDL cholesterol levels were elevated at 128.  This is bad cholesterol that forms plaques in the arteries that cause strokes and heart attacks, so we want the levels as low as possible  While we'll discuss the details at your next appointment, we wanted to share some initial thoughts and next steps.  Diet: Focus on avoiding foods that have saturated fats, high cholesterol, or high levels of carbohydrates like sugar, starch, and grains.  Even more important, be sure to eat lots of healthy omega-3 unsaturated fats like Extra Virgin Olive Oil, fatty fish, nuts, and avocados. Exercise: Regular physical activity is crucial for overall health and can benefit your cholesterol. Weight Management: If you're overweight or obese, reaching a healthy weight can positively impact your cholesterol. Potential Medication: Depending on your individual risk factors, we might explore the benefits of medications to further manage your cholesterol and heart health.  We'll discuss this in detail at your next visit.  Collaboration is Key: We value your active participation in your care plan. At your next appointment, we can discuss your questions and preferences to create a personalized approach that works best for you.  Next Steps: We encourage you to make any suggested lifestyle changes as described above We encourage you to get cholesterol rechecked in 6-12 months If you're interested, we could have an extra appointment to: (1) Review your cholesterol results in more detail. (2) Discuss personalized lifestyle changes and potential medication options. (3) Answer your questions and address any concerns you may have. (4) Discuss additional cholesterol testing options and cardiac risk evaluation tests that are available  In the meantime, you can find some resources on heart-healthy living at  [email or web address] Please let us know if you have any questions before your next appointment.

## 2023-02-11 ENCOUNTER — Ambulatory Visit (INDEPENDENT_AMBULATORY_CARE_PROVIDER_SITE_OTHER)
Admission: RE | Admit: 2023-02-11 | Discharge: 2023-02-11 | Disposition: A | Discharge status: Home / Self Care | Payer: Commercial Managed Care - PPO | Source: Ambulatory Visit | Attending: Internal Medicine | Admitting: Internal Medicine

## 2023-02-11 DIAGNOSIS — R590 Localized enlarged lymph nodes: Secondary | ICD-10-CM | POA: Diagnosis not present

## 2023-02-13 ENCOUNTER — Ambulatory Visit
Admission: RE | Admit: 2023-02-13 | Discharge: 2023-02-13 | Disposition: A | Discharge status: Home / Self Care | Payer: Commercial Managed Care - PPO | Source: Ambulatory Visit | Attending: Internal Medicine | Admitting: Internal Medicine

## 2023-02-13 DIAGNOSIS — R599 Enlarged lymph nodes, unspecified: Secondary | ICD-10-CM

## 2023-02-13 MED ORDER — IOPAMIDOL (ISOVUE-300) INJECTION 61%
100.0000 mL | Freq: Once | INTRAVENOUS | Status: AC | PRN
  Administered 2023-02-13: 100 mL via INTRAVENOUS

## 2023-02-15 NOTE — Progress Notes (Signed)
MyChart Notification: Chest X-Ray Results and Next Steps Date: February 15, 2023  Imaging Results: A chest x-ray was performed yesterday, June 20th, 2024, to evaluate for possible enlarged lymph nodes in the chest  Findings:  The overall image of your heart and lungs appears normal. There is no evidence of collapsed lung (pneumothorax) or fluid around the lungs (pleural effusion). Enlarged lymph nodes (adenopathy) were seen near your right lung. This finding could be a sign of lymphoma, but other benign causes are also possible. Impression: The x-ray cannot definitively rule out enlarged lymph nodes, especially on the right side. A follow-up CT scan of the chest with contrast dye is recommended for a more detailed evaluation.  We understand you may be concerned about lymphoma, and this CT scan will help [place] get a clearer picture.  We are committed to helping you get a diagnosis. We will discuss these results with you in more detail and explain the next steps in your evaluation.  Based on the x-ray, the next step would be to schedule a CT scan of the chest with contrast dye. Would you like me to take care of scheduling this for you.  Please note: This is a summary of your results. We will review the complete report and discuss the findings with you in more detail at your next appointment.  If you have any questions or concerns, please contact us  Sincerely, Lula Olszewski, MD  02/15/2023 1:05 PM   [facility]:  [phone]

## 2023-02-17 ENCOUNTER — Encounter: Payer: Self-pay | Admitting: Internal Medicine

## 2023-02-17 DIAGNOSIS — M47816 Spondylosis without myelopathy or radiculopathy, lumbar region: Secondary | ICD-10-CM | POA: Insufficient documentation

## 2023-02-17 DIAGNOSIS — K5730 Diverticulosis of large intestine without perforation or abscess without bleeding: Secondary | ICD-10-CM | POA: Insufficient documentation

## 2023-02-17 DIAGNOSIS — N200 Calculus of kidney: Secondary | ICD-10-CM | POA: Insufficient documentation

## 2023-02-17 NOTE — Progress Notes (Signed)
MyChart comments provided for patient education and problem list updated.   Positive Findings:  Your CT scan results are largely reassuring. There were no abnormalities detected in your lungs, liver, gallbladder, pancreas, spleen, adrenal glands, urinary bladder, stomach, bowels (except for diverticulosis), appendix, aorta, iliac arteries, lymph nodes, prostate, or bones (aside from degenerative spine changes). Additionally, there were no signs of infection, inflammation, or bleeding in your abdomen or pelvis.  The suspected inguinal hernia was confirmed to be tiny and not a lymph node.  Areas to Discuss with Your Doctor:  Kidney Stones (Nephrolithiasis): Small stones (1-21mm) were found in both of your kidneys. While these often pass on their own, it would be beneficial to discuss the following with your doctor: Increasing fluid intake to help flush out the stones. Potential medications to aid in passing the stones or preventing future ones. The need for future imaging to monitor these stones. Colonic Diverticulosis: This condition involves small pouches in the lining of your colon. It's common, especially with age, and often doesn't cause problems. However, if you experience lower abdominal cramping or pain, changes in bowel habits (diarrhea, constipation), or rectal bleeding, it's important to discuss this with your doctor. Inguinal Hernia: A small hernia was detected in your right groin. While hernias can sometimes cause discomfort or pain, your doctor can provide guidance on whether the hernia needs repair and how to manage any symptoms. Degenerative Spine Changes: These changes are a form of wear and tear on your spine that can cause pain. If you're experiencing any discomfort, discuss potential treatment options with your doctor.  Overall: While there are a few findings to discuss with your doctor, most are not concerning. It's important to follow up to get more recommendations based on your  specific situation and any symptoms you may be experiencing.

## 2023-02-21 ENCOUNTER — Encounter (HOSPITAL_BASED_OUTPATIENT_CLINIC_OR_DEPARTMENT_OTHER): Payer: Self-pay

## 2023-02-21 ENCOUNTER — Other Ambulatory Visit (HOSPITAL_BASED_OUTPATIENT_CLINIC_OR_DEPARTMENT_OTHER): Payer: Self-pay

## 2023-02-21 ENCOUNTER — Other Ambulatory Visit: Payer: Self-pay | Admitting: Family Medicine

## 2023-02-21 ENCOUNTER — Ambulatory Visit (INDEPENDENT_AMBULATORY_CARE_PROVIDER_SITE_OTHER): Payer: Commercial Managed Care - PPO | Admitting: Internal Medicine

## 2023-02-21 ENCOUNTER — Encounter: Payer: Self-pay | Admitting: Internal Medicine

## 2023-02-21 VITALS — BP 120/88 | HR 62 | Temp 98.0°F | Ht 70.25 in | Wt 231.0 lb

## 2023-02-21 DIAGNOSIS — K625 Hemorrhage of anus and rectum: Secondary | ICD-10-CM

## 2023-02-21 DIAGNOSIS — E65 Localized adiposity: Secondary | ICD-10-CM

## 2023-02-21 DIAGNOSIS — M47816 Spondylosis without myelopathy or radiculopathy, lumbar region: Secondary | ICD-10-CM

## 2023-02-21 DIAGNOSIS — R590 Localized enlarged lymph nodes: Secondary | ICD-10-CM

## 2023-02-21 DIAGNOSIS — N200 Calculus of kidney: Secondary | ICD-10-CM

## 2023-02-21 DIAGNOSIS — K4090 Unilateral inguinal hernia, without obstruction or gangrene, not specified as recurrent: Secondary | ICD-10-CM | POA: Diagnosis not present

## 2023-02-21 DIAGNOSIS — E785 Hyperlipidemia, unspecified: Secondary | ICD-10-CM | POA: Diagnosis not present

## 2023-02-21 MED ORDER — ZEPBOUND 2.5 MG/0.5ML ~~LOC~~ SOAJ
SUBCUTANEOUS | 3 refills | Status: DC
  Filled 2023-02-21: qty 2, 28d supply, fill #0
  Filled 2023-04-08: qty 2, 28d supply, fill #1
  Filled 2023-05-07 – 2023-05-11 (×2): qty 2, 28d supply, fill #2
  Filled 2023-06-05: qty 2, 28d supply, fill #3
  Filled 2023-06-10: qty 2, 30d supply, fill #3
  Filled 2023-06-23 – 2023-07-03 (×2): qty 2, 28d supply, fill #3

## 2023-02-21 MED ORDER — ROSUVASTATIN CALCIUM 20 MG PO TABS
20.0000 mg | ORAL_TABLET | Freq: Every day | ORAL | 3 refills | Status: DC
  Filled 2023-02-21: qty 30, 30d supply, fill #0
  Filled 2023-03-24: qty 30, 30d supply, fill #1
  Filled 2023-04-22: qty 30, 30d supply, fill #2
  Filled 2023-05-23: qty 30, 30d supply, fill #3
  Filled 2023-06-23: qty 30, 30d supply, fill #4
  Filled 2023-07-19 – 2023-07-20 (×2): qty 30, 30d supply, fill #5
  Filled 2023-09-20 – 2023-10-02 (×3): qty 30, 30d supply, fill #6
  Filled 2023-11-11: qty 30, 30d supply, fill #7
  Filled 2023-12-23: qty 30, 30d supply, fill #8
  Filled 2024-01-19: qty 30, 30d supply, fill #9
  Filled 2024-02-21: qty 30, 30d supply, fill #10

## 2023-02-22 ENCOUNTER — Encounter: Payer: Self-pay | Admitting: Internal Medicine

## 2023-02-22 NOTE — Assessment & Plan Note (Signed)
Armpit Lymphadenopathy: He has bilateral armpit lymphadenopathy, with a CT scan ruling out lymphoma in the chest and abdomen. We will complete an ultrasound of the armpits to further evaluate the lymphadenopathy and consider avoiding deodorant to see if irritation resolves.

## 2023-02-22 NOTE — Patient Instructions (Signed)
It was a pleasure seeing you today! Your health and satisfaction are our top priorities.  Glenetta Hew, MD  After your visit we were able to get the ultrasound scheduled.  VISIT SUMMARY:  During your recent visit, we discussed your concerns about your cholesterol levels and potential lymphoma. We reviewed your cholesterol profile and discussed the risks associated with your current levels. We also discussed your concerns about swollen lymph nodes in your armpits. A CT scan did not reveal any evidence of lymphoma, but we will conduct an ultrasound for further investigation. We also discussed your history of diverticulosis and a hernia, and the presence of a small kidney stone detected during the scan. We discussed potential medication options for managing your cholesterol levels and aiding in weight loss.  YOUR PLAN:  -HIGH CHOLESTEROL: High cholesterol can increase the risk of heart disease. We are considering starting you on Rosuvastatin to lower your LDL cholesterol and have recommended dietary changes to improve your cholesterol profile.  -OVERWEIGHT: Being overweight can impact your cholesterol profile and overall health. We are considering weight loss medications such as Zetbound or Wegovy, or alternatives like Metformin, Topiramate, or Wellbutrin. We also recommend increasing your exercise regimen.  -ARMPIT LYMPHADENOPATHY: Swollen lymph nodes in your armpits can be caused by various conditions. We will conduct an ultrasound to further investigate and recommend avoiding deodorant to see if the swelling resolves.  -INGUINAL HERNIA: An inguinal hernia is a condition where part of your intestine bulges through a weak spot in your abdominal muscles. Your hernia is not causing symptoms and does not require surgical intervention at this time.  -KIDNEY STONE: Kidney stones are hard deposits made of minerals and salts that form inside your kidneys. You should increase your water intake to help  prevent the stone from causing problems.  -DEGENERATIVE DISC DISEASE AND DIVERTICULOSIS: Degenerative disc disease is a condition where the discs in your spine wear down over time, and diverticulosis is a condition where small pouches form in your colon. No specific intervention is needed for these conditions at this time.  -TESTOSTERONE LEVEL: Low testosterone levels can cause various symptoms. We will consider checking your testosterone levels if you report symptoms such as low libido or erectile issues.  INSTRUCTIONS:  Please schedule an ultrasound for further investigation of the swollen lymph nodes in your armpits. Also, consider the dietary changes and exercise regimen we discussed to improve your cholesterol profile and overall health. We will discuss potential medication options for managing your cholesterol levels and aiding in weight loss during your next visit.   NEXT STEPS: [x]  Early Intervention: Schedule sooner appointment, call our on-call services, or go to emergency room if there is any significant Increase in pain or discomfort New or worsening symptoms Sudden or severe changes in your health [x]  Flexible Follow-Up: We recommend a No follow-ups on file. for optimal routine care. This allows for progress monitoring and treatment adjustments. [x]  Preventive Care: Schedule your annual preventive care visit! It's typically covered by insurance and helps identify potential health issues early. [x]  Lab & X-ray Appointments: Incomplete tests scheduled today, or call to schedule. X-rays: [facility] (M-F, 8:30am-noon or 1pm-5pm). [x]  Medical Information Release: Sign a release form at front desk to obtain relevant medical information we don't have.  MAKING THE MOST OF OUR FOCUSED 20 MINUTE APPOINTMENTS: [x]   Clearly state your top concerns at the beginning of the visit to focus our discussion [x]   If you anticipate you will need more  time, please inform the front  desk during scheduling - we can book multiple appointments in the same week. [x]   If you have transportation problems- use our convenient video appointments or ask about transportation support. [x]   We can get down to business faster if you use MyChart to update information before the visit and submit non-urgent questions before your visit. Thank you for taking the time to provide details through MyChart.  Let our nurse know and she can import this information into your encounter documents.  Arrival and Wait Times: [x]   Arriving on time ensures that everyone receives prompt attention. [x]   Early morning (8a) and afternoon (1p) appointments tend to have shortest wait times. [x]   Unfortunately, we cannot delay appointments for late arrivals or hold slots during phone calls.  Getting Answers and Following Up [x]   Simple Questions & Concerns: For quick questions or basic follow-up after your visit, reach [place] at [phone] or MyChart messaging. [x]   Complex Concerns: If your concern is more complex, scheduling an appointment might be best. Discuss this with the staff to find the most suitable option. [x]   Lab & Imaging Results: We'll contact you directly if results are abnormal or you don't use MyChart. Most normal results will be on MyChart within 2-3 business days, with a review message from Dr. Jon Billings. Haven't heard back in 2 weeks? Need results sooner? Contact us at [phone]. [x]   Referrals: Our referral coordinator will manage specialist referrals. The specialist's office should contact you within 2 weeks to schedule an appointment. Call us if you haven't heard from them after 2 weeks.  Staying Connected [x]   MyChart: Activate your MyChart for the fastest way to access results and message [place]. See the last page of this paperwork for instructions on how to activate.  Bring to Your Next Appointment [x]   Medications: Please bring all your medication bottles to your next appointment to  ensure we have an accurate record of your prescriptions. [x]   Health Diaries: If you're monitoring any health conditions at home, keeping a diary of your readings can be very helpful for discussions at your next appointment.  Billing [x]   X-ray & Lab Orders: These are billed by separate companies. Contact the invoicing company directly for questions or concerns. [x]   Visit Charges: Discuss any billing inquiries with our administrative services team.  Your Satisfaction Matters [x]   Share Your Experience: We strive for your satisfaction! If you have any complaints, or preferably compliments, please let Dr. Jon Billings know directly or contact our Practice Administrators, Edwena Felty or [employer], by asking at the front desk.   Reviewing Your Records [x]   Review this early draft of your clinical encounter notes below and the final encounter summary tomorrow on MyChart after its been completed.  All orders placed so far are visible here: Hyperlipidemia, acquired -     Rosuvastatin Calcium; Take 1 tablet (20 mg total) by mouth daily.  Dispense: 90 tablet; Refill: 3  Truncal obesity -     Zepbound; Inject 2.5 mg into the skin once a week for 28 days  Dispense: 2 mL; Refill: 3

## 2023-02-22 NOTE — Assessment & Plan Note (Signed)
Have offered referral for colonoscopy.

## 2023-02-22 NOTE — Assessment & Plan Note (Addendum)
Overweight: We discussed the impact of weight on the cholesterol profile and overall health, including the potential benefits of weight loss medications. We will consider starting Zetbound or Wegovy if covered by insurance; if not, Metformin, Topiramate, or Wellbutrin are alternatives. He should increase his exercise regimen.  Testosterone Level: We discussed the potential benefits of testosterone replacement if levels are low. We will consider checking testosterone levels if he reports low libido or erectile issues.

## 2023-02-22 NOTE — Assessment & Plan Note (Signed)
Inguinal Hernia: An inguinal hernia was identified on a CT scan but is not causing symptoms. No surgical intervention is needed at this time unless symptoms develop.

## 2023-02-22 NOTE — Assessment & Plan Note (Signed)
Hyperlipidemia: LDL is 128, HDL 45, and total cholesterol 188. We discussed the importance of the HDL/LDL ratio and atherosclerosis' long-term implications. We will consider starting Rosuvastatin to lower LDL and implement dietary changes, including increased intake of fish, flax seeds, nuts, extra virgin olive oil, and avocados to improve the cholesterol profile.

## 2023-02-22 NOTE — Assessment & Plan Note (Signed)
Degenerative Disc Disease and Diverticulosis: Both were identified on a CT scan. No specific intervention is needed at this time.

## 2023-02-22 NOTE — Assessment & Plan Note (Signed)
Kidney Stone: A small stone was identified on a CT scan. He should increase water intake to prevent stone passage.

## 2023-02-22 NOTE — Progress Notes (Signed)
[facility]: [phone]   Routine Medical Office Visit  Patient:  Spencer Phillips      Age: 37 y.o.       Sex:  male  Date:   02/21/2023 Patient Care Team: Lula Olszewski, MD as PCP - General (Internal Medicine) Today's Healthcare Provider: Lula Olszewski, MD   Assessment and Plan:   Anees was seen today for follow-up for lab results.  Hyperlipidemia, acquired Overview: Medications: not yet discussed Lab Results  Component Value Date   HDL 44.70 02/07/2023   CHOLHDL 4 02/07/2023   Lab Results  Component Value Date   LDLCALC 128 (H) 02/07/2023   Lab Results  Component Value Date   TRIG 79.0 02/07/2023   Lab Results  Component Value Date   CHOL 188 02/07/2023   The ASCVD Risk score (Arnett DK, et al., 2019) failed to calculate for the following reasons:   The 2019 ASCVD risk score is only valid for ages 68 to 52 Lab Results  Component Value Date   ALT 24 02/07/2023   AST 18 02/07/2023   ALKPHOS 79 02/07/2023   TSH 1.40 02/07/2023   HGBA1C 4.8 02/07/2023   Body mass index is 33.42 kg/m.  Lipoprotein(a), Apolipoprotein B (ApoB), and High-sensitivity C-reactive protein (hs-CRP) No results found for: "HSCRP", "LIPOA" Improving Your Cholesterol: Diet: Focus on a Mediterranean-style diet, limit saturated fats and sugars, and increase omega-3 fatty acids (fish, flaxseeds,nuts,extra virgin olive oil, avocados). Exercise: Engage in regular physical activity (aerobic exercises are particularly beneficial for HDL). Weight Management: Maintain a healthy weight. Smoking Avoidance: Quitting smoking improves cholesterol levels.     Assessment & Plan: Hyperlipidemia: LDL is 128, HDL 45, and total cholesterol 188. We discussed the importance of the HDL/LDL ratio and atherosclerosis' long-term implications. We will consider starting Rosuvastatin to lower LDL and implement dietary changes, including increased intake of fish, flax seeds,  nuts, extra virgin olive oil, and avocados to improve the cholesterol profile.  Orders: -     Rosuvastatin Calcium; Take 1 tablet (20 mg total) by mouth daily.  Dispense: 90 tablet; Refill: 3  Truncal obesity Assessment & Plan: Overweight: We discussed the impact of weight on the cholesterol profile and overall health, including the potential benefits of weight loss medications. We will consider starting Zetbound or Wegovy if covered by insurance; if not, Metformin, Topiramate, or Wellbutrin are alternatives. He should increase his exercise regimen.  Testosterone Level: We discussed the potential benefits of testosterone replacement if levels are low. We will consider checking testosterone levels if he reports low libido or erectile issues.   Orders: -     Zepbound; Inject 2.5 mg into the skin once a week for 28 days  Dispense: 2 mL; Refill: 3  Enlarged lymph nodes in armpit Overview: February 17, 2023 MyChart comments:   XR abnormal->02/17/23 CT scan results: found no lymphadenopathy   02/07/2023   Really noticed in the past year Flares sometimes, other times feels like nothing and not very sensitive. Other times its hard to even put his arm down its so sensitive.  Mostly in past year, prior to that he would occasionally feel something in the axilla.  For the past year its been both arm pits  Thought it might be shingles because he had shingles episode a year prior but the medication(s) he took for shingles took care.    Assessment & Plan: Armpit Lymphadenopathy: He has bilateral armpit lymphadenopathy, with a CT scan ruling out lymphoma in the  chest and abdomen. We will complete an ultrasound of the armpits to further evaluate the lymphadenopathy and consider avoiding deodorant to see if irritation resolves.   Hernia, inguinal, right Overview: Pain and pressure, sometimes very severe on right ? If might be lymphadenopathy.   Inguinal Hernia: He reports a lump in the right groin,  possibly bilateral, with associated discomfort, especially when lifting heavy objects, present for 2-3 years.  02/17/23 CT to evaluate noted Tiny fat containing right inguinal hernia.   Assessment & Plan: Inguinal Hernia: An inguinal hernia was identified on a CT scan but is not causing symptoms. No surgical intervention is needed at this time unless symptoms develop.   BRBPR (bright red blood per rectum) Overview: >>OVERVIEW FOR BLOOD IN STOOL WRITTEN ON 02/07/2023 11:36 AM BY Lula Olszewski, MD  a few times a year. never evaluated. Believes it to be hemorrhoid. Never treated. Sometimes uncomfortable.    Assessment & Plan: Have offered referral for colonoscopy.   Multiple kidney stones Overview: Incidental finding February 17, 2023 MyChart comments:   Kidney Stones (Nephrolithiasis): Small stones (1-74mm) were found in both of your kidneys. While these often pass on their own, it would be beneficial to discuss the following with your doctor:  Increasing fluid intake to help flush out the stones. Potential medications to aid in passing the stones or preventing future ones. The need for future imaging to monitor these stones.  Assessment & Plan: Kidney Stone: A small stone was identified on a CT scan. He should increase water intake to prevent stone passage.   Spondylosis of lumbar region without myelopathy or radiculopathy Overview: Didn't complaining of   Incidental finding on CT abdomen/pelvis  02/17/23 No suspicious lytic or blastic osseous lesions. No acute displaced fracture. Multilevel degenerative changes of the spine.    Assessment & Plan: Degenerative Disc Disease and Diverticulosis: Both were identified on a CT scan. No specific intervention is needed at this time.    Medical Decision Making: 2 or more stable chronic illnesses 1 undiagnosed new problem with uncertain prognosis Tests, documents, or independent historian(s). Any combination of 3 from the following:       Review of the result(s) of each unique test;  Prescription drug management           Clinical Presentation:    37 y.o. male who has Benign skin lesion of forearm; Enlarged lymph nodes in armpit; Hernia, inguinal, right; BRBPR (bright red blood per rectum); Truncal obesity; Hyperlipidemia, acquired; Multiple kidney stones; Diverticula of colon; and Degenerative joint disease (DJD) of lumbar spine on their problem list. He presents to the office today for Follow-up for lab results   AI-Extracted: Discussed the use of AI scribe software for clinical note transcription with the patient, who gave verbal consent to proceed.  History of Present Illness   The patient, a 37 year old individual, presented for a follow-up consultation regarding concerns about his cholesterol levels and potential lymphoma. The patient's cholesterol profile revealed an LDL of 128 and an HDL of 45, which was explained to him in detail. The patient was not previously familiar with the significance of these values and their implications on overall health and longevity. He was informed about the potential risks associated with his current cholesterol profile, including the increased likelihood of atherosclerosis, heart attacks, and strokes.  The patient was also informed about the genetic influence on his HDL levels and the limited options available to increase these levels. He was advised about dietary changes that could potentially  improve his cholesterol profile, including the incorporation of fish, flax seeds, nuts, extra virgin olive oil, and avocados into his diet. The patient was also informed about the limited impact of exercise on cholesterol levels, but the potential benefits of maintaining a healthy weight and muscle tone.  In addition to the cholesterol concerns, the patient was also worried about potential lymphoma due to the presence of swollen lymph nodes in his armpits. He underwent a CT scan, which did not reveal  any evidence of lymphoma. However, the patient was advised to undergo an ultrasound of the armpits to further investigate the swollen lymph nodes. He reported that the swelling in the right armpit was particularly bothersome earlier in the week, but had since subsided.  The patient also reported a history of diverticulosis and a hernia in the groin area, which was confirmed by the CT scan. He was advised to increase his water intake due to the presence of a small kidney stone detected during the scan. The patient was also informed about the presence of some wearing of the discs in his back.  The patient was open to the idea of taking medication to manage his cholesterol levels and was interested in exploring weight loss medication options. He reported exercising a few times a week, but not engaging in any resistance training. The patient was advised to consider increasing his exercise levels, but he expressed a preference for medication to aid in weight loss.  The patient was also advised to avoid using deodorant for a while to see if it was contributing to the swelling in his armpits. He was informed that the results of the ultrasound would provide further clarity on this issue. The patient was reassured that the likelihood of lymphoma was low based on the current findings, but further investigation was necessary to confirm this.        Reviewed chart data: Past Medical History:  Diagnosis Date   Blood in stool    a few times a year. never evaluated.     No outpatient medications prior to visit.   No facility-administered medications prior to visit.         Clinical Data Analysis:   Physical Exam  BP 120/88 (BP Location: Left Arm, Patient Position: Sitting)   Pulse 62   Temp 98 F (36.7 C) (Temporal)   Ht 5' 10.25" (1.784 m)   Wt 231 lb (104.8 kg)   SpO2 95%   BMI 32.91 kg/m  Wt Readings from Last 10 Encounters:  02/21/23 231 lb (104.8 kg)  02/07/23 234 lb 9.6 oz (106.4 kg)   03/02/20 232 lb 6.1 oz (105.4 kg)  04/04/17 230 lb 6.4 oz (104.5 kg)   Vital signs reviewed.  Nursing notes reviewed. Weight trend reviewed. Abnormalities and Problem-Specific physical exam findings:  truncal adiposity.  General Appearance:  No acute distress appreciable.   Well-groomed, healthy-appearing male.  Well proportioned with no abnormal fat distribution.  Good muscle tone. Skin: Clear and well-hydrated. Pulmonary:  Normal work of breathing at rest, no respiratory distress apparent. SpO2: 95 %  Musculoskeletal: All extremities are intact.  Neurological:  Awake, alert, oriented, and engaged.  No obvious focal neurological deficits or cognitive impairments.  Sensorium seems unclouded.   Speech is clear and coherent with logical content. Psychiatric:  Appropriate mood, pleasant and cooperative demeanor, thoughtful and engaged during the exam  Results Reviewed:    No results found for any visits on 02/21/23.  Office Visit on 02/07/2023  Component Date Value  WBC 02/07/2023 8.6    RBC 02/07/2023 5.25    Platelets 02/07/2023 317.0    Hemoglobin 02/07/2023 15.6    HCT 02/07/2023 46.1    MCV 02/07/2023 87.9    MCHC 02/07/2023 33.8    RDW 02/07/2023 13.7    Sodium 02/07/2023 139    Potassium 02/07/2023 4.1    Chloride 02/07/2023 103    CO2 02/07/2023 27    Glucose, Bld 02/07/2023 86    BUN 02/07/2023 15    Creatinine, Ser 02/07/2023 0.84    Total Bilirubin 02/07/2023 0.7    Alkaline Phosphatase 02/07/2023 79    AST 02/07/2023 18    ALT 02/07/2023 24    Total Protein 02/07/2023 6.9    Albumin 02/07/2023 4.6    GFR 02/07/2023 110.08    Calcium 02/07/2023 9.3    Cholesterol 02/07/2023 188    Triglycerides 02/07/2023 79.0    HDL 02/07/2023 44.70    VLDL 02/07/2023 15.8    LDL Cholesterol 02/07/2023 128 (H)    Total CHOL/HDL Ratio 02/07/2023 4    NonHDL 02/07/2023 143.79    TSH 02/07/2023 1.40    Hgb A1c MFr Bld 02/07/2023 4.8    No image results found.   CT Abdomen  Pelvis W Contrast  Result Date: 02/17/2023 CLINICAL DATA:  Lymphadenopathy, groin Abdominal pain, acute, nonlocalized EXAM: CT ABDOMEN AND PELVIS WITH CONTRAST TECHNIQUE: Multidetector CT imaging of the abdomen and pelvis was performed using the standard protocol following bolus administration of intravenous contrast. RADIATION DOSE REDUCTION: This exam was performed according to the departmental dose-optimization program which includes automated exposure control, adjustment of the mA and/or kV according to patient size and/or use of iterative reconstruction technique. CONTRAST:  ISOVUE-300 IOPAMIDOL (ISOVUE-300) INJECTION 61% COMPARISON:  None Available. FINDINGS: Lower chest: No acute abnormality. Hepatobiliary: No focal liver abnormality. No gallstones, gallbladder wall thickening, or pericholecystic fluid. No biliary dilatation. Pancreas: No focal lesion. Normal pancreatic contour. No surrounding inflammatory changes. No main pancreatic ductal dilatation. Spleen: Normal in size without focal abnormality. Adrenals/Urinary Tract: No adrenal nodule bilaterally. Bilateral kidneys enhance symmetrically. 2 mm calcified stone within the right kidney. Punctate calcification left kidney. No ureterolithiasis bilaterally. No hydronephrosis. No hydroureter. The urinary bladder is unremarkable. Stomach/Bowel: PO contrast reaches the sigmoid colon. Stomach is within normal limits. No evidence of bowel wall thickening or dilatation. Colonic diverticulosis. Normal appendix along the gallbladder (5:104). Vascular/Lymphatic: No abdominal aorta or iliac aneurysm. Mild atherosclerotic plaque of the aorta and its branches. No abdominal, pelvic, or inguinal lymphadenopathy. Reproductive: Prostate is unremarkable. Other: No intraperitoneal free fluid. No intraperitoneal free gas. No organized fluid collection. Musculoskeletal: Tiny fat containing right inguinal hernia. No suspicious lytic or blastic osseous lesions. No acute  displaced fracture. Multilevel degenerative changes of the spine. IMPRESSION: 1. Nonobstructive 1-2 mm nephrolithiasis bilaterally. 2. Colonic diverticulosis with no acute diverticulitis. 3. Tiny fat containing right inguinal hernia. Electronically Signed   By: Tish Frederickson M.D.   On: 02/17/2023 00:25   DG Chest 2 View  Result Date: 02/14/2023 CLINICAL DATA:  bilateral axillary adenopathy eval for lymphoma EXAM: CHEST - 2 VIEW COMPARISON:  None Available. FINDINGS: Cardiac silhouette is unremarkable. No pneumothorax or pleural effusion. Hilar prominence on the right. The possibility of adenopathy could be evaluated further with a CT of the chest with contrast. The visualized skeletal structures are unremarkable. IMPRESSION: 1. Cannot exclude hilar adenopathy, particularly on the right. Consider follow up with contrast CT. 2. Otherwise no acute cardiopulmonary process. Electronically Signed  By: Layla Maw M.D.   On: 02/14/2023 22:00       This encounter employed real-time, collaborative documentation. The patient actively reviewed and updated their medical record on a shared screen, ensuring transparency and facilitating joint problem-solving for the problem list, overview, and plan. This approach promotes accurate, informed care. The treatment plan was discussed and reviewed in detail, including medication safety, potential side effects, and all patient questions. We confirmed understanding and comfort with the plan. Follow-up instructions were established, including contacting the office for any concerns, returning if symptoms worsen, persist, or new symptoms develop, and precautions for potential emergency department visits. ----------------------------------------------------- Lula Olszewski, MD  02/22/2023 11:40 AM  [facility]:  [phone]

## 2023-02-25 ENCOUNTER — Ambulatory Visit: Payer: Commercial Managed Care - PPO | Admitting: Internal Medicine

## 2023-02-27 ENCOUNTER — Telehealth: Payer: Self-pay

## 2023-02-27 NOTE — Telephone Encounter (Signed)
Patient Advocate Encounter  Prior Authorization for Zepbound 2.5MG /0.5ML pen-injectors has been approved through [employer].    Key: [id number]  Effective: 02-27-2023 to 08-30-2023

## 2023-03-05 ENCOUNTER — Ambulatory Visit
Admission: RE | Admit: 2023-03-05 | Discharge: 2023-03-05 | Disposition: A | Discharge status: Home / Self Care | Payer: Commercial Managed Care - PPO | Source: Ambulatory Visit | Attending: Family Medicine | Admitting: Family Medicine

## 2023-03-05 ENCOUNTER — Ambulatory Visit: Payer: Commercial Managed Care - PPO | Admitting: Internal Medicine

## 2023-03-05 DIAGNOSIS — R590 Localized enlarged lymph nodes: Secondary | ICD-10-CM

## 2023-03-06 ENCOUNTER — Other Ambulatory Visit: Payer: Self-pay

## 2023-03-06 ENCOUNTER — Other Ambulatory Visit (HOSPITAL_BASED_OUTPATIENT_CLINIC_OR_DEPARTMENT_OTHER): Payer: Self-pay

## 2023-03-06 DIAGNOSIS — E65 Localized adiposity: Secondary | ICD-10-CM

## 2023-03-06 NOTE — Telephone Encounter (Signed)
Spoke with pharmacy and was told that patient would be responsible for a copay of $24.95 with each prescription pick-up. Informed patient of this information.

## 2023-03-07 ENCOUNTER — Other Ambulatory Visit (HOSPITAL_BASED_OUTPATIENT_CLINIC_OR_DEPARTMENT_OTHER): Payer: Self-pay

## 2023-03-19 ENCOUNTER — Ambulatory Visit: Payer: Commercial Managed Care - PPO | Admitting: Internal Medicine

## 2023-03-29 ENCOUNTER — Encounter: Payer: Self-pay | Admitting: Internal Medicine

## 2023-03-29 ENCOUNTER — Ambulatory Visit (INDEPENDENT_AMBULATORY_CARE_PROVIDER_SITE_OTHER): Payer: Commercial Managed Care - PPO | Admitting: Internal Medicine

## 2023-03-29 ENCOUNTER — Other Ambulatory Visit (HOSPITAL_BASED_OUTPATIENT_CLINIC_OR_DEPARTMENT_OTHER): Payer: Self-pay

## 2023-03-29 VITALS — BP 110/71 | HR 82 | Temp 98.0°F | Ht 70.25 in | Wt 228.0 lb

## 2023-03-29 DIAGNOSIS — K4090 Unilateral inguinal hernia, without obstruction or gangrene, not specified as recurrent: Secondary | ICD-10-CM | POA: Diagnosis not present

## 2023-03-29 DIAGNOSIS — L989 Disorder of the skin and subcutaneous tissue, unspecified: Secondary | ICD-10-CM

## 2023-03-29 DIAGNOSIS — K5730 Diverticulosis of large intestine without perforation or abscess without bleeding: Secondary | ICD-10-CM

## 2023-03-29 DIAGNOSIS — N200 Calculus of kidney: Secondary | ICD-10-CM

## 2023-03-29 DIAGNOSIS — E785 Hyperlipidemia, unspecified: Secondary | ICD-10-CM

## 2023-03-29 DIAGNOSIS — T7840XA Allergy, unspecified, initial encounter: Secondary | ICD-10-CM | POA: Insufficient documentation

## 2023-03-29 DIAGNOSIS — K625 Hemorrhage of anus and rectum: Secondary | ICD-10-CM

## 2023-03-29 DIAGNOSIS — M47816 Spondylosis without myelopathy or radiculopathy, lumbar region: Secondary | ICD-10-CM

## 2023-03-29 DIAGNOSIS — K5909 Other constipation: Secondary | ICD-10-CM

## 2023-03-29 DIAGNOSIS — T7840XD Allergy, unspecified, subsequent encounter: Secondary | ICD-10-CM

## 2023-03-29 MED ORDER — "STRAINER/STAINLESS STEEL/2.5"" MISC"
1.0000 | Freq: Every day | 0 refills | Status: DC
  Filled 2023-03-29: qty 1, fill #0

## 2023-03-29 NOTE — Assessment & Plan Note (Addendum)
A small asymptomatic hernia identified on CT scan. We will maintain low weight, avoid heavy lifting and smoking, increase fiber intake to prevent constipation, engage in low-impact exercises, avoid exercises that strain the abdominal area, consider supportive undergarments if needed, monitor for progression, and consider surgery if symptoms worsen.  Discussed repair option, he will do conservative management only artificial intelligence AVS info produced and shared.

## 2023-03-29 NOTE — Assessment & Plan Note (Addendum)
Asymptomatic diverticula found on CT scan, with a history of low fiber intake. We will increase fiber intake and consider fiber gummies for supplementation.  Encouraged patient to fiber supplement with gummies.  Eat more fruits and vegetables.

## 2023-03-29 NOTE — Assessment & Plan Note (Addendum)
Dermatology referral recommended as I'm not 100% certain its benign might need biopsy.Unknown etiology, possibly related to deodorant allergy, evolving over time. We will trial over-the-counter treatments in sequence: hydrocortisone, antifungal, antibiotic, and refer to dermatology for further evaluation and possible biopsy. Encouraged patient to try hydrocortisone, then antifungal like lotrimin, then neosporin and report back on efficacy of each.  Through a comprehensive analysis of anonymized patient data, we informed an AI-generated report. All AI suggestions underwent careful evaluation against established guidelines to ensure optimal patient care. During our collaborative review of the report's findings, we discussed potential diagnoses, recommended tests, treatment options, and next steps. I explained the rationale behind the AI recommendations, integrating my clinical judgment for the patient's understanding.  In collaboration with the patient, we explored the potential benefits and risks associated with certain categories of AI-suggested interventions. For example, we discussed the advantages and considerations of more aggressive treatment options. Ultimately, the patient elected to closely monitor the situation based on their preferences and our shared understanding of their condition. Patient's best interests and evidence-based practices guided all decisions, respecting their autonomy. Moving forward, we will continue close monitoring and revisit these options at the next visit or sooner if their condition changes. The patient was encouraged to keep me informed of any developments."  AI-Generated Report (red):    Based on the image and the description provided, here's a potential differential diagnosis and treatment plan for the arm lesion:  Differential Diagnosis:  1. Chronic Urticaria: Given the recurrent nature and the description of lesions coming and going over years.  2. Eczema  (Atopic Dermatitis): The reddish, slightly scaly appearance is consistent with eczema.  3. Psoriasis: Though less likely, early psoriatic lesions can present similarly.  4. Contact Dermatitis: If the patient has been exposed to irritants or allergens.  5. Insect Bite Reaction: Particularly if the initial presentation was a punctate spot.  6. Cutaneous Lupus: While less common, it's a consideration for recurring skin lesions.  Treatment Plan:  1. Detailed History: Gather information about potential triggers, allergies, and timing of flare-ups.  2. Skin Biopsy: To definitively diagnose the condition if clinical diagnosis remains uncertain.  3. Topical Corticosteroids: For short-term relief of inflammation and itching.  4. Antihistamines: To manage itching, particularly if urticaria is suspected.  5. Moisturizers: To improve skin barrier function and reduce irritation.  6. Trigger Avoidance: Identify and avoid potential allergens or irritants.  7. Phototherapy: For conditions like psoriasis or eczema if they don't respond to topical treatments.  8. Systemic Medications: Consider immunosuppressants or biologics for severe, persistent cases.  9. Follow-up: Regular monitoring to assess treatment efficacy and adjust as needed.  10. Patient Education: Provide information on skincare, potential triggers, and when to seek medical attention.  It's important to note that a definitive diagnosis and treatment plan should be made by a dermatologist after a thorough examination and potentially further testing. This recommendation is based on limited information and should be verified by a healthcare professional.

## 2023-03-29 NOTE — Progress Notes (Signed)
[facility]: [phone]   Routine Medical Office Visit  Patient:  Spencer Phillips      Age: 37 y.o.       Sex:  male  Date:   03/29/2023 Patient Care Team: Lula Olszewski, MD as PCP - General (Internal Medicine) Today's Healthcare Provider: Lula Olszewski, MD   Assessment and Plan:   Carle was seen today for 5 week follow-up and medical management of chronic issues.  Multiple kidney stones Overview: Incidental finding February 17, 2023 MyChart comments:   Kidney Stones (Nephrolithiasis): Small stones (1-64mm) were found in both of your kidneys. While these often pass on their own, it would be beneficial to discuss the following with your doctor:  Increasing fluid intake to help flush out the stones. Potential medications to aid in passing the stones or preventing future ones. The need for future imaging to monitor these stones.  He thinks he flushed it with severe back pain July 2024- water  Assessment & Plan: A 2mm calcified stone in the right kidney likely passed with an episode of severe lower back pain. We will increase fluid intake, consider dietary changes to prevent recurrence, and consider straining urine during future episodes of similar pain to identify the stone type.  In my medical opinion  we can just do XR in 6 months and even that's optional.   We did extensive education and counseling - see AVS  Orders: -     Strainer/Stainless Steel/2.5"; 1 Device by Does not apply route daily at 6 (six) AM.  Dispense: 1 each; Refill: 0  Diverticula of colon Overview: Incidental finding on CT for lymphadenopathy, February 17, 2023 MyChart comments:   This condition involves small pouches in the lining of your colon. It's common, especially with age, and often doesn't cause problems. However, if you experience lower abdominal cramping or pain, changes in bowel habits (diarrhea, constipation), or rectal bleeding, it's important to discuss this with your  doctor.   Assessment & Plan: Asymptomatic diverticula found on CT scan, with a history of low fiber intake. We will increase fiber intake and consider fiber gummies for supplementation.  Encouraged patient to fiber supplement with gummies.  Eat more fruits and vegetables.   Hernia, inguinal, right Overview: Pain and pressure, sometimes very severe on right ? If might be lymphadenopathy.  Inguinal Hernia: He reports a lump in the right groin, possibly bilateral, with associated discomfort, especially when lifting heavy objects, present for 2-3 years.  02/17/23 CT to evaluate noted Tiny fat containing right inguinal hernia.   Assessment & Plan: A small asymptomatic hernia identified on CT scan. We will maintain low weight, avoid heavy lifting and smoking, increase fiber intake to prevent constipation, engage in low-impact exercises, avoid exercises that strain the abdominal area, consider supportive undergarments if needed, monitor for progression, and consider surgery if symptoms worsen.  Discussed repair option, he will do conservative management only artificial intelligence AVS info produced and shared.   Benign skin lesion of forearm Overview: Photographs Taken 02/07/2023 :   Come and go, stay for a few weeks  Photographs Taken 03/29/2023 :     Assessment & Plan: Dermatology referral recommended as I'm not 100% certain its benign might need biopsy.Unknown etiology, possibly related to deodorant allergy, evolving over time. We will trial over-the-counter treatments in sequence: hydrocortisone, antifungal, antibiotic, and refer to dermatology for further evaluation and possible biopsy. Encouraged patient to try hydrocortisone, then antifungal like lotrimin, then neosporin and report back on  efficacy of each.  Through a comprehensive analysis of anonymized patient data, we informed an AI-generated report. All AI suggestions underwent careful evaluation against established guidelines to  ensure optimal patient care. During our collaborative review of the report's findings, we discussed potential diagnoses, recommended tests, treatment options, and next steps. I explained the rationale behind the AI recommendations, integrating my clinical judgment for the patient's understanding.  In collaboration with the patient, we explored the potential benefits and risks associated with certain categories of AI-suggested interventions. For example, we discussed the advantages and considerations of more aggressive treatment options. Ultimately, the patient elected to closely monitor the situation based on their preferences and our shared understanding of their condition. Patient's best interests and evidence-based practices guided all decisions, respecting their autonomy. Moving forward, we will continue close monitoring and revisit these options at the next visit or sooner if their condition changes. The patient was encouraged to keep me informed of any developments."  AI-Generated Report (red):    Based on the image and the description provided, here's a potential differential diagnosis and treatment plan for the arm lesion:  Differential Diagnosis:  1. Chronic Urticaria: Given the recurrent nature and the description of lesions coming and going over years.  2. Eczema (Atopic Dermatitis): The reddish, slightly scaly appearance is consistent with eczema.  3. Psoriasis: Though less likely, early psoriatic lesions can present similarly.  4. Contact Dermatitis: If the patient has been exposed to irritants or allergens.  5. Insect Bite Reaction: Particularly if the initial presentation was a punctate spot.  6. Cutaneous Lupus: While less common, it's a consideration for recurring skin lesions.  Treatment Plan:  1. Detailed History: Gather information about potential triggers, allergies, and timing of flare-ups.  2. Skin Biopsy: To definitively diagnose the condition if clinical diagnosis  remains uncertain.  3. Topical Corticosteroids: For short-term relief of inflammation and itching.  4. Antihistamines: To manage itching, particularly if urticaria is suspected.  5. Moisturizers: To improve skin barrier function and reduce irritation.  6. Trigger Avoidance: Identify and avoid potential allergens or irritants.  7. Phototherapy: For conditions like psoriasis or eczema if they don't respond to topical treatments.  8. Systemic Medications: Consider immunosuppressants or biologics for severe, persistent cases.  9. Follow-up: Regular monitoring to assess treatment efficacy and adjust as needed.  10. Patient Education: Provide information on skincare, potential triggers, and when to seek medical attention.  It's important to note that a definitive diagnosis and treatment plan should be made by a dermatologist after a thorough examination and potentially further testing. This recommendation is based on limited information and should be verified by a healthcare professional.  Orders: -     Ambulatory referral to Dermatology  BRBPR (bright red blood per rectum) Overview: >>OVERVIEW FOR BLOOD IN STOOL WRITTEN ON 02/07/2023 11:36 AM BY Lula Olszewski, MD  a few times a year. never evaluated. Believes it to be hemorrhoid. Never treated. Sometimes uncomfortable.    Assessment & Plan: Likely due to hemorrhoids and diverticulosis. We will consider a colonoscopy for further evaluation. Asked referral coordinator to follow up on delayed referral    Spondylosis of lumbar region without myelopathy or radiculopathy Overview: Incidental finding on CT abdomen/pelvis  02/17/23 No suspicious lytic or blastic osseous lesions. No acute displaced fracture. Multilevel degenerative changes of the spine.     Hyperlipidemia, acquired Overview: Medications: initd  rosuvastatin 01/2023 Lab Results  Component Value Date   HDL 44.70 02/07/2023   CHOLHDL 4 02/07/2023  Lab Results   Component Value Date   LDLCALC 128 (H) 02/07/2023   Lab Results  Component Value Date   TRIG 79.0 02/07/2023   Lab Results  Component Value Date   CHOL 188 02/07/2023   The ASCVD Risk score (Arnett DK, et al., 2019) failed to calculate for the following reasons:   The 2019 ASCVD risk score is only valid for ages 75 to 37 Lab Results  Component Value Date   ALT 24 02/07/2023   AST 18 02/07/2023   ALKPHOS 79 02/07/2023   TSH 1.40 02/07/2023   HGBA1C 4.8 02/07/2023   Body mass index is 33.42 kg/m.  Lipoprotein(a), Apolipoprotein B (ApoB), and High-sensitivity C-reactive protein (hs-CRP) No results found for: "HSCRP", "LIPOA" Improving Your Cholesterol: Diet: Focus on a Mediterranean-style diet, limit saturated fats and sugars, and increase omega-3 fatty acids (fish, flaxseeds,nuts,extra virgin olive oil, avocados). Exercise: Engage in regular physical activity (aerobic exercises are particularly beneficial for HDL). Weight Management: Maintain a healthy weight. Smoking Avoidance: Quitting smoking improves cholesterol levels.     Assessment & Plan: Slightly elevated LDL noted in June's blood work. We will continue monitoring and statin   Hypersensitivity reaction, subsequent encounter Overview: Axillary Lymphadenopathy Resolved after discontinuing deodorant, likely due to an allergic reaction. By the time of ultrasound and CT, no lymphadenopathy was found, making lymphoma very unlikely.  We will consider reintroducing a low-allergy deodorant.   Chronic constipation Overview: Asymptomatic, but presumed present due to comorbid diverticula and inguinal hernia. Likely due to low fiber intake. We will increase fiber intake and consider fiber gummies for supplementation.     Extensive problem list management and updating today for longitudinal care and coordinatioin with refer care to gastrointestinal and dermatology .  Follow up as needed for problems, confirmed annual next  June   Future Appointments  Date Time Provider Department Center  02/10/2024  8:20 AM Lula Olszewski, MD [facility]           Clinical Presentation:    37 y.o. male who has Benign skin lesion of forearm; Hernia, inguinal, right; BRBPR (bright red blood per rectum); Truncal obesity; Hyperlipidemia, acquired; Multiple kidney stones; Diverticula of colon; Degenerative joint disease (DJD) of lumbar spine; Hypersensitivity reaction; and Chronic constipation on their problem list. His reasons/main concerns/chief complaints for today's office visit are 5 week follow-up and Medical Management of Chronic Issues   AI-Extracted: Discussed the use of AI scribe software for clinical note transcription with the patient, who gave verbal consent to proceed.  History of Present Illness   The patient, with a history of chronic health issues, presented for a follow-up consultation. He also reported a brief episode of severe lower back pain, which was suspected to be due to the passing of a kidney stone identified in a previous CT scan.  He has been managing a small right inguinal hernia, which has been asymptomatic and not causing any significant discomfort or interference with daily activities. He expressed no immediate desire for surgical intervention unless the hernia progresses to a point where it becomes problematic.  He also reported a recurring skin condition on the forearms, characterized by lesions that start as tiny red spots and evolve over a month. The lesions have been coming and going for years, and he has not sought any specific treatment for them.  He has been experiencing bright red blood per rectum, which has been attributed to hemorrhoids and diverticular disease, both likely due to chronic constipation.  In summary, the  patient's current health status is characterized by multiple chronic conditions, including sensitive lymph nodes, a small right inguinal hernia, a recurring skin  condition, and gastrointestinal issues. He is actively managing these conditions and is open to further diagnostic procedures and treatments as necessary.      He  has a past medical history of Blood in stool and Enlarged lymph nodes in armpit (02/07/2023). Current Outpatient Medications on File Prior to Visit  Medication Sig   rosuvastatin (CRESTOR) 20 MG tablet Take 1 tablet (20 mg total) by mouth daily.   tirzepatide (ZEPBOUND) 2.5 MG/0.5ML Pen Inject 2.5 mg into the skin once a week for 28 days   No current facility-administered medications on file prior to visit.   There are no discontinued medications.       Clinical Data Analysis:   Physical Exam  BP 110/71 (BP Location: Left Arm, Patient Position: Sitting)   Pulse 82   Temp 98 F (36.7 C) (Temporal)   Ht 5' 10.25" (1.784 m)   Wt 228 lb (103.4 kg)   SpO2 96%   BMI 32.48 kg/m  Wt Readings from Last 10 Encounters:  03/29/23 228 lb (103.4 kg)  02/21/23 231 lb (104.8 kg)  02/07/23 234 lb 9.6 oz (106.4 kg)  03/02/20 232 lb 6.1 oz (105.4 kg)  04/04/17 230 lb 6.4 oz (104.5 kg)   Vital signs reviewed.  Nursing notes reviewed. Weight trend reviewed. General Appearance:  No acute distress appreciable.   Well-groomed, healthy-appearing male.  Well proportioned with no abnormal fat distribution.  Good muscle tone. Skin: Clear and well-hydrated, except skin lesions imaged in problem overview  Pulmonary:  Normal work of breathing at rest, no respiratory distress apparent. SpO2: 96 %  Musculoskeletal: All extremities are intact.  Neurological:  Awake, alert, oriented, and engaged.  No obvious focal neurological deficits or cognitive impairments.  Sensorium seems unclouded.   Speech is clear and coherent with logical content. Psychiatric:  Appropriate mood, pleasant and cooperative demeanor, thoughtful and engaged during the exam  Results Reviewed:      No results found for any visits on 03/29/23.  Office Visit on 02/07/2023   Component Date Value   WBC 02/07/2023 8.6    RBC 02/07/2023 5.25    Platelets 02/07/2023 317.0    Hemoglobin 02/07/2023 15.6    HCT 02/07/2023 46.1    MCV 02/07/2023 87.9    MCHC 02/07/2023 33.8    RDW 02/07/2023 13.7    Sodium 02/07/2023 139    Potassium 02/07/2023 4.1    Chloride 02/07/2023 103    CO2 02/07/2023 27    Glucose, Bld 02/07/2023 86    BUN 02/07/2023 15    Creatinine, Ser 02/07/2023 0.84    Total Bilirubin 02/07/2023 0.7    Alkaline Phosphatase 02/07/2023 79    AST 02/07/2023 18    ALT 02/07/2023 24    Total Protein 02/07/2023 6.9    Albumin 02/07/2023 4.6    GFR 02/07/2023 110.08    Calcium 02/07/2023 9.3    Cholesterol 02/07/2023 188    Triglycerides 02/07/2023 79.0    HDL 02/07/2023 44.70    VLDL 02/07/2023 15.8    LDL Cholesterol 02/07/2023 128 (H)    Total CHOL/HDL Ratio 02/07/2023 4    NonHDL 02/07/2023 143.79    TSH 02/07/2023 1.40    Hgb A1c MFr Bld 02/07/2023 4.8    No image results found.   MM 3D DIAGNOSTIC MAMMOGRAM BILATERAL BREAST  Result Date: 03/05/2023 CLINICAL DATA:  37 year old male presenting  for evaluation of bilateral axillary sensitivity. No palpable lumps. His physician is questioning the presence lymphadenopathy. EXAM: DIGITAL DIAGNOSTIC BILATERAL MAMMOGRAM WITH TOMOSYNTHESIS AND CAD; [place] AXILLARY LEFT; [place] AXILLARY RIGHT TECHNIQUE: Bilateral digital diagnostic mammography and breast tomosynthesis was performed. The images were evaluated with computer-aided detection. ; Targeted ultrasound examination of the left axilla was performed.; Targeted ultrasound examination of the right axilla was performed. COMPARISON:  None available. ACR Breast Density Category a: The breasts are almost entirely fatty. FINDINGS: No suspicious calcifications, masses or areas of distortion are seen in the bilateral breasts. Ultrasound targeted to the right and left axilla demonstrates multiple normal-appearing lymph nodes. No suspicious masses or other sonographic  abnormalities are identified. IMPRESSION: 1.  No evidence of bilateral axillary lymphadenopathy. 2.  No evidence of malignancy in the bilateral breast tissue. RECOMMENDATION: Clinical follow-up recommended for the bilateral axillary sensitivity. I have discussed the findings and recommendations with the patient. If applicable, a reminder letter will be sent to the patient regarding the next appointment. BI-RADS CATEGORY  1: Negative. Electronically Signed   By: Frederico Hamman M.D.   On: 03/05/2023 10:13  [place] AXILLA LEFT  Result Date: 03/05/2023 CLINICAL DATA:  37 year old male presenting for evaluation of bilateral axillary sensitivity. No palpable lumps. His physician is questioning the presence lymphadenopathy. EXAM: DIGITAL DIAGNOSTIC BILATERAL MAMMOGRAM WITH TOMOSYNTHESIS AND CAD; [place] AXILLARY LEFT; [place] AXILLARY RIGHT TECHNIQUE: Bilateral digital diagnostic mammography and breast tomosynthesis was performed. The images were evaluated with computer-aided detection. ; Targeted ultrasound examination of the left axilla was performed.; Targeted ultrasound examination of the right axilla was performed. COMPARISON:  None available. ACR Breast Density Category a: The breasts are almost entirely fatty. FINDINGS: No suspicious calcifications, masses or areas of distortion are seen in the bilateral breasts. Ultrasound targeted to the right and left axilla demonstrates multiple normal-appearing lymph nodes. No suspicious masses or other sonographic abnormalities are identified. IMPRESSION: 1.  No evidence of bilateral axillary lymphadenopathy. 2.  No evidence of malignancy in the bilateral breast tissue. RECOMMENDATION: Clinical follow-up recommended for the bilateral axillary sensitivity. I have discussed the findings and recommendations with the patient. If applicable, a reminder letter will be sent to the patient regarding the next appointment. BI-RADS CATEGORY  1: Negative. Electronically Signed   By: Frederico Hamman M.D.   On: 03/05/2023 10:13  [place] AXILLA RIGHT  Result Date: 03/05/2023 CLINICAL DATA:  36 year old male presenting for evaluation of bilateral axillary sensitivity. No palpable lumps. His physician is questioning the presence lymphadenopathy. EXAM: DIGITAL DIAGNOSTIC BILATERAL MAMMOGRAM WITH TOMOSYNTHESIS AND CAD; [place] AXILLARY LEFT; [place] AXILLARY RIGHT TECHNIQUE: Bilateral digital diagnostic mammography and breast tomosynthesis was performed. The images were evaluated with computer-aided detection. ; Targeted ultrasound examination of the left axilla was performed.; Targeted ultrasound examination of the right axilla was performed. COMPARISON:  None available. ACR Breast Density Category a: The breasts are almost entirely fatty. FINDINGS: No suspicious calcifications, masses or areas of distortion are seen in the bilateral breasts. Ultrasound targeted to the right and left axilla demonstrates multiple normal-appearing lymph nodes. No suspicious masses or other sonographic abnormalities are identified. IMPRESSION: 1.  No evidence of bilateral axillary lymphadenopathy. 2.  No evidence of malignancy in the bilateral breast tissue. RECOMMENDATION: Clinical follow-up recommended for the bilateral axillary sensitivity. I have discussed the findings and recommendations with the patient. If applicable, a reminder letter will be sent to the patient regarding the next appointment. BI-RADS CATEGORY  1: Negative. Electronically Signed   By: Frederico Hamman  M.D.   On: 03/05/2023 10:13  CT Abdomen Pelvis W Contrast  Result Date: 02/17/2023 CLINICAL DATA:  Lymphadenopathy, groin Abdominal pain, acute, nonlocalized EXAM: CT ABDOMEN AND PELVIS WITH CONTRAST TECHNIQUE: Multidetector CT imaging of the abdomen and pelvis was performed using the standard protocol following bolus administration of intravenous contrast. RADIATION DOSE REDUCTION: This exam was performed according to the departmental dose-optimization program  which includes automated exposure control, adjustment of the mA and/or kV according to patient size and/or use of iterative reconstruction technique. CONTRAST:  ISOVUE-300 IOPAMIDOL (ISOVUE-300) INJECTION 61% COMPARISON:  None Available. FINDINGS: Lower chest: No acute abnormality. Hepatobiliary: No focal liver abnormality. No gallstones, gallbladder wall thickening, or pericholecystic fluid. No biliary dilatation. Pancreas: No focal lesion. Normal pancreatic contour. No surrounding inflammatory changes. No main pancreatic ductal dilatation. Spleen: Normal in size without focal abnormality. Adrenals/Urinary Tract: No adrenal nodule bilaterally. Bilateral kidneys enhance symmetrically. 2 mm calcified stone within the right kidney. Punctate calcification left kidney. No ureterolithiasis bilaterally. No hydronephrosis. No hydroureter. The urinary bladder is unremarkable. Stomach/Bowel: PO contrast reaches the sigmoid colon. Stomach is within normal limits. No evidence of bowel wall thickening or dilatation. Colonic diverticulosis. Normal appendix along the gallbladder (5:104). Vascular/Lymphatic: No abdominal aorta or iliac aneurysm. Mild atherosclerotic plaque of the aorta and its branches. No abdominal, pelvic, or inguinal lymphadenopathy. Reproductive: Prostate is unremarkable. Other: No intraperitoneal free fluid. No intraperitoneal free gas. No organized fluid collection. Musculoskeletal: Tiny fat containing right inguinal hernia. No suspicious lytic or blastic osseous lesions. No acute displaced fracture. Multilevel degenerative changes of the spine. IMPRESSION: 1. Nonobstructive 1-2 mm nephrolithiasis bilaterally. 2. Colonic diverticulosis with no acute diverticulitis. 3. Tiny fat containing right inguinal hernia. Electronically Signed   By: Tish Frederickson M.D.   On: 02/17/2023 00:25   DG Chest 2 View  Result Date: 02/14/2023 CLINICAL DATA:  bilateral axillary adenopathy eval for lymphoma EXAM: CHEST  - 2 VIEW COMPARISON:  None Available. FINDINGS: Cardiac silhouette is unremarkable. No pneumothorax or pleural effusion. Hilar prominence on the right. The possibility of adenopathy could be evaluated further with a CT of the chest with contrast. The visualized skeletal structures are unremarkable. IMPRESSION: 1. Cannot exclude hilar adenopathy, particularly on the right. Consider follow up with contrast CT. 2. Otherwise no acute cardiopulmonary process. Electronically Signed   By: Layla Maw M.D.   On: 02/14/2023 22:00    MM 3D DIAGNOSTIC MAMMOGRAM BILATERAL BREAST  Result Date: 03/05/2023 CLINICAL DATA:  37 year old male presenting for evaluation of bilateral axillary sensitivity. No palpable lumps. His physician is questioning the presence lymphadenopathy. EXAM: DIGITAL DIAGNOSTIC BILATERAL MAMMOGRAM WITH TOMOSYNTHESIS AND CAD; [place] AXILLARY LEFT; [place] AXILLARY RIGHT TECHNIQUE: Bilateral digital diagnostic mammography and breast tomosynthesis was performed. The images were evaluated with computer-aided detection. ; Targeted ultrasound examination of the left axilla was performed.; Targeted ultrasound examination of the right axilla was performed. COMPARISON:  None available. ACR Breast Density Category a: The breasts are almost entirely fatty. FINDINGS: No suspicious calcifications, masses or areas of distortion are seen in the bilateral breasts. Ultrasound targeted to the right and left axilla demonstrates multiple normal-appearing lymph nodes. No suspicious masses or other sonographic abnormalities are identified. IMPRESSION: 1.  No evidence of bilateral axillary lymphadenopathy. 2.  No evidence of malignancy in the bilateral breast tissue. RECOMMENDATION: Clinical follow-up recommended for the bilateral axillary sensitivity. I have discussed the findings and recommendations with the patient. If applicable, a reminder letter will be sent to the patient regarding the next appointment. BI-RADS  CATEGORY  1:  Negative. Electronically Signed   By: Frederico Hamman M.D.   On: 03/05/2023 10:13  [place] AXILLA LEFT  Result Date: 03/05/2023 CLINICAL DATA:  37 year old male presenting for evaluation of bilateral axillary sensitivity. No palpable lumps. His physician is questioning the presence lymphadenopathy. EXAM: DIGITAL DIAGNOSTIC BILATERAL MAMMOGRAM WITH TOMOSYNTHESIS AND CAD; [place] AXILLARY LEFT; [place] AXILLARY RIGHT TECHNIQUE: Bilateral digital diagnostic mammography and breast tomosynthesis was performed. The images were evaluated with computer-aided detection. ; Targeted ultrasound examination of the left axilla was performed.; Targeted ultrasound examination of the right axilla was performed. COMPARISON:  None available. ACR Breast Density Category a: The breasts are almost entirely fatty. FINDINGS: No suspicious calcifications, masses or areas of distortion are seen in the bilateral breasts. Ultrasound targeted to the right and left axilla demonstrates multiple normal-appearing lymph nodes. No suspicious masses or other sonographic abnormalities are identified. IMPRESSION: 1.  No evidence of bilateral axillary lymphadenopathy. 2.  No evidence of malignancy in the bilateral breast tissue. RECOMMENDATION: Clinical follow-up recommended for the bilateral axillary sensitivity. I have discussed the findings and recommendations with the patient. If applicable, a reminder letter will be sent to the patient regarding the next appointment. BI-RADS CATEGORY  1: Negative. Electronically Signed   By: Frederico Hamman M.D.   On: 03/05/2023 10:13  [place] AXILLA RIGHT  Result Date: 03/05/2023 CLINICAL DATA:  36 year old male presenting for evaluation of bilateral axillary sensitivity. No palpable lumps. His physician is questioning the presence lymphadenopathy. EXAM: DIGITAL DIAGNOSTIC BILATERAL MAMMOGRAM WITH TOMOSYNTHESIS AND CAD; [place] AXILLARY LEFT; [place] AXILLARY RIGHT TECHNIQUE: Bilateral digital diagnostic mammography and breast  tomosynthesis was performed. The images were evaluated with computer-aided detection. ; Targeted ultrasound examination of the left axilla was performed.; Targeted ultrasound examination of the right axilla was performed. COMPARISON:  None available. ACR Breast Density Category a: The breasts are almost entirely fatty. FINDINGS: No suspicious calcifications, masses or areas of distortion are seen in the bilateral breasts. Ultrasound targeted to the right and left axilla demonstrates multiple normal-appearing lymph nodes. No suspicious masses or other sonographic abnormalities are identified. IMPRESSION: 1.  No evidence of bilateral axillary lymphadenopathy. 2.  No evidence of malignancy in the bilateral breast tissue. RECOMMENDATION: Clinical follow-up recommended for the bilateral axillary sensitivity. I have discussed the findings and recommendations with the patient. If applicable, a reminder letter will be sent to the patient regarding the next appointment. BI-RADS CATEGORY  1: Negative. Electronically Signed   By: Frederico Hamman M.D.   On: 03/05/2023 10:13     This encounter employed real-time, collaborative documentation. The patient actively reviewed and updated their medical record on a shared screen, ensuring transparency and facilitating joint problem-solving for the problem list, overview, and plan. This approach promotes accurate, informed care. The treatment plan was discussed and reviewed in detail, including medication safety, potential side effects, and all patient questions. We confirmed understanding and comfort with the plan. Follow-up instructions were established, including contacting the office for any concerns, returning if symptoms worsen, persist, or new symptoms develop, and precautions for potential emergency department visits. ----------------------------------------------------- Lula Olszewski, MD  03/29/2023 10:16 AM  [facility]:   [phone]

## 2023-03-29 NOTE — Assessment & Plan Note (Signed)
Slightly elevated LDL noted in June's blood work. We will continue monitoring and statin

## 2023-03-29 NOTE — Patient Instructions (Addendum)
Managing a small right inguinal hernia involves a combination of conservative measures and careful monitoring. Here's a comprehensive explanation of how to manage this condition and when to consider surgery:  1. Conservative Management:  a) Watchful waiting: For small, asymptomatic hernias, your doctor may recommend observing the hernia without immediate intervention.  b) Lifestyle modifications: - Maintain a healthy weight to reduce pressure on the abdominal wall - Avoid heavy lifting or straining during bowel movements - Quit smoking, as it can weaken connective tissues  c) Dietary changes: - Increase fiber intake to prevent constipation - Stay hydrated to promote regular bowel movements  d) Exercise: - Engage in low-impact exercises to strengthen core muscles - Avoid exercises that strain the abdominal area  e) Hernia truss or support: - In some cases, a doctor may recommend wearing a supportive undergarment to help hold the hernia in place  2. Pain management: - Over-the-counter pain relievers like ibuprofen or acetaminophen can help manage discomfort - Apply ice packs to reduce swelling and pain  3. Regular monitoring: - Schedule follow-up appointments with your doctor to assess the hernia's progression - Be alert to any changes in size, color, or discomfort  4. Considering Surgery:  Surgery becomes a consideration when:  a) The hernia is growing larger: - This indicates that conservative measures are not preventing progression  b) Pain or discomfort increases: - Persistent or worsening pain can significantly impact quality of life  c) The hernia becomes irreducible: - If the protruding tissue can't be pushed back into the abdominal cavity  d) Risk of strangulation: - When blood supply to the herniated tissue is compromised, causing severe pain, nausea, vomiting, or fever - This is a medical emergency requiring immediate surgical intervention  e) Interference with  daily activities: - When the hernia significantly impacts work, exercise, or other routine activities  f) Cosmetic concerns: - If the visible bulge causes significant distress to the patient  5. Types of surgical repair:  a) Open hernia repair: - Traditional method involving a single incision - Can be performed under local or general anesthesia  b) Laparoscopic repair: - Minimally invasive technique using small incisions and a camera - Often results in faster recovery and less post-operative pain  c) Robotic-assisted repair: - Advanced technique offering enhanced precision - May be beneficial for complex cases  6. Factors influencing the decision for surgery:  a) Age and overall health of the patient b) Size and location of the hernia c) Severity of symptoms d) Risk of complications if left untreated e) Patient's preferences and lifestyle needs  7. Post-surgical considerations:  a) Recovery time varies depending on the surgical technique b) Follow post-operative instructions carefully c) Gradual return to normal activities as advised by the surgeon d) Long-term lifestyle modifications to prevent recurrence  In conclusion, managing a small right inguinal hernia often begins with conservative measures and careful monitoring. The decision to pursue surgical intervention should be made in consultation with a healthcare provider, considering the hernia's progression, associated symptoms, and impact on quality of life. Regular follow-ups and staying alert to changes in the hernia's condition are crucial in determining the appropriate time for surgical repair.  Would you like me to elaborate on any specific aspect of hernia management or surgical considerations?  Dietary Guidelines for Preventing Kidney Stones  Stay Hydrated  Drink 8-12 cups (2-3 liters) of fluid daily  Water is best, but other fluids count too  Aim for pale or clear urine Limit Sodium  Reduce salt intake  to  less than 2,300 mg per day  Avoid processed foods and canned soups  Use herbs and spices instead of salt for flavoring Moderate Protein Intake  Aim for 0.8-1 gram of protein per kg of body weight  Choose lean meats, fish, and plant-based proteins Increase Calcium from Foods  Consume 1,000-1,200 mg of calcium daily  Choose low-fat dairy products  Calcium-rich vegetables: broccoli, kale, collard greens Limit Animal Protein  Reduce consumption of red meat, organ meats, and shellfish  These foods can increase uric acid levels Avoid Excess Vitamin C Supplements  Don't exceed 1,000 mg of vitamin C daily from supplements  Obtain vitamin C from fruits and vegetables instead Increase Citrus Intake  Consume lemons, limes, and oranges  These fruits contain citrate, which may prevent stone formation Moderate Oxalate-Rich Foods  Limit high-oxalate foods like spinach, nuts, and chocolate  If consuming these foods, pair with calcium-rich foods Limit Added Sugars  Reduce intake of sugary drinks and desserts  Choose whole fruits over fruit juices Consider Potassium-Rich Foods  Include bananas, potatoes, and leafy greens in your diet  Potassium may help prevent calcium from leaving the body  Remember: Always consult with your healthcare provider or a registered dietitian for personalized advice, especially if you have other health conditions or dietary restrictions. Would you like me to elaborate on any part of this handout or modify it in any way?  For the skin lesion on your arm: Based on the image and the description provided, here's a potential differential diagnosis and treatment plan for the arm lesion:  Differential Diagnosis:  1. Chronic Urticaria: Given the recurrent nature and the description of lesions coming and going over years.  2. Eczema (Atopic Dermatitis): The reddish, slightly scaly appearance is consistent with eczema.  3. Psoriasis: Though less likely, early psoriatic  lesions can present similarly.  4. Contact Dermatitis: If the patient has been exposed to irritants or allergens.  5. Insect Bite Reaction: Particularly if the initial presentation was a punctate spot.  6. Cutaneous Lupus: While less common, it's a consideration for recurring skin lesions.  Treatment Plan:  1. Detailed History: Gather information about potential triggers, allergies, and timing of flare-ups.  2. Skin Biopsy: To definitively diagnose the condition if clinical diagnosis remains uncertain.  3. Topical Corticosteroids: For short-term relief of inflammation and itching.  4. Antihistamines: To manage itching, particularly if urticaria is suspected.  5. Moisturizers: To improve skin barrier function and reduce irritation.  6. Trigger Avoidance: Identify and avoid potential allergens or irritants.  7. Phototherapy: For conditions like psoriasis or eczema if they don't respond to topical treatments.  8. Systemic Medications: Consider immunosuppressants or biologics for severe, persistent cases.  9. Follow-up: Regular monitoring to assess treatment efficacy and adjust as needed.  10. Patient Education: Provide information on skincare, potential triggers, and when to seek medical attention.  It's important to note that a definitive diagnosis and treatment plan should be made by a dermatologist after a thorough examination and potentially further testing. This recommendation is based on limited information and should be verified by a healthcare professional.

## 2023-03-29 NOTE — Assessment & Plan Note (Addendum)
Likely due to hemorrhoids and diverticulosis. We will consider a colonoscopy for further evaluation. Asked referral coordinator to follow up on delayed referral

## 2023-03-29 NOTE — Assessment & Plan Note (Addendum)
A 2mm calcified stone in the right kidney likely passed with an episode of severe lower back pain. We will increase fluid intake, consider dietary changes to prevent recurrence, and consider straining urine during future episodes of similar pain to identify the stone type.  In my medical opinion  we can just do XR in 6 months and even that's optional.   We did extensive education and counseling - see AVS

## 2023-04-08 ENCOUNTER — Encounter (HOSPITAL_BASED_OUTPATIENT_CLINIC_OR_DEPARTMENT_OTHER): Payer: Self-pay

## 2023-04-08 ENCOUNTER — Other Ambulatory Visit (HOSPITAL_BASED_OUTPATIENT_CLINIC_OR_DEPARTMENT_OTHER): Payer: Self-pay

## 2023-04-09 ENCOUNTER — Other Ambulatory Visit (HOSPITAL_COMMUNITY): Payer: Self-pay

## 2023-04-09 ENCOUNTER — Telehealth: Payer: Self-pay

## 2023-04-09 NOTE — Telephone Encounter (Signed)
Pharmacy Patient Advocate Encounter   Received notification from CoverMyMeds that prior authorization for Zepbound 2.5MG /0.5ML pen-injectors is required/requested.   Insurance verification completed.   The patient is insured through [facility] .   Per test claim: The current 28 day co-pay is, $226.29.  No PA needed at this time. This test claim was processed through [facility]- copay amounts may vary at other pharmacies due to pharmacy/plan contracts, or as the patient moves through the different stages of their insurance plan.     Insurance requires patient to titrate up every month until maintenance dose is reached.

## 2023-04-10 ENCOUNTER — Other Ambulatory Visit (HOSPITAL_BASED_OUTPATIENT_CLINIC_OR_DEPARTMENT_OTHER): Payer: Self-pay

## 2023-05-10 ENCOUNTER — Encounter (HOSPITAL_BASED_OUTPATIENT_CLINIC_OR_DEPARTMENT_OTHER): Payer: Self-pay

## 2023-05-11 ENCOUNTER — Other Ambulatory Visit (HOSPITAL_BASED_OUTPATIENT_CLINIC_OR_DEPARTMENT_OTHER): Payer: Self-pay

## 2023-05-30 ENCOUNTER — Ambulatory Visit: Payer: Commercial Managed Care - PPO | Admitting: Dermatology

## 2023-06-03 ENCOUNTER — Encounter: Payer: Self-pay | Admitting: Dermatology

## 2023-06-03 ENCOUNTER — Ambulatory Visit (INDEPENDENT_AMBULATORY_CARE_PROVIDER_SITE_OTHER): Payer: Commercial Managed Care - PPO | Admitting: Dermatology

## 2023-06-03 ENCOUNTER — Other Ambulatory Visit (HOSPITAL_BASED_OUTPATIENT_CLINIC_OR_DEPARTMENT_OTHER): Payer: Self-pay

## 2023-06-03 DIAGNOSIS — R21 Rash and other nonspecific skin eruption: Secondary | ICD-10-CM | POA: Diagnosis not present

## 2023-06-03 DIAGNOSIS — L578 Other skin changes due to chronic exposure to nonionizing radiation: Secondary | ICD-10-CM

## 2023-06-03 DIAGNOSIS — D1801 Hemangioma of skin and subcutaneous tissue: Secondary | ICD-10-CM

## 2023-06-03 DIAGNOSIS — L814 Other melanin hyperpigmentation: Secondary | ICD-10-CM

## 2023-06-03 DIAGNOSIS — D229 Melanocytic nevi, unspecified: Secondary | ICD-10-CM

## 2023-06-03 DIAGNOSIS — Z1283 Encounter for screening for malignant neoplasm of skin: Secondary | ICD-10-CM

## 2023-06-03 DIAGNOSIS — L739 Follicular disorder, unspecified: Secondary | ICD-10-CM

## 2023-06-03 DIAGNOSIS — W908XXA Exposure to other nonionizing radiation, initial encounter: Secondary | ICD-10-CM

## 2023-06-03 DIAGNOSIS — L821 Other seborrheic keratosis: Secondary | ICD-10-CM

## 2023-06-03 MED ORDER — MUPIROCIN 2 % EX OINT
1.0000 | TOPICAL_OINTMENT | Freq: Three times a day (TID) | CUTANEOUS | 0 refills | Status: DC
  Filled 2023-06-03: qty 22, 7d supply, fill #0

## 2023-06-03 NOTE — Patient Instructions (Signed)

## 2023-06-03 NOTE — Progress Notes (Signed)
   New Patient Visit   Subjective  Spencer Phillips is a 37 y.o. male who presents for the following: Skin Cancer Screening and Upper Body Skin Exam - no personal or family history of skin cancer. He gets some bumps that come and go on his arms and finers that start out looking like pimples and they get sore. They pop and scab over. This has being on for about 4-5 years.  The patient presents for Upper Body Skin Exam (UBSE) for skin cancer screening and mole check. The patient has spots, moles and lesions to be evaluated, some may be new or changing and the patient may have concern these could be cancer.  The following portions of the chart were reviewed this encounter and updated as appropriate: medications, allergies, medical history  Review of Systems:  No other skin or systemic complaints except as noted in HPI or Assessment and Plan.  Objective  Well appearing patient in no apparent distress; mood and affect are within normal limits.  All skin waist up examined. Relevant physical exam findings are noted in the Assessment and Plan.  Right Elbow Dermal tender pink nodules         Assessment & Plan   Rash- Ddx PCT vs Contact Dermatitis vs Folliculitis vs Less likely Sweet's vs other Right Elbow  Skin / nail biopsy - Right Elbow Type of biopsy: punch   Informed consent: discussed and consent obtained   Timeout: patient name, date of birth, surgical site, and procedure verified   Procedure prep:  Patient was prepped and draped in usual sterile fashion (the patient was cleaned and prepped) Prep type:  Isopropyl alcohol Anesthesia: the lesion was anesthetized in a standard fashion   Anesthetic:  1% lidocaine w/ epinephrine 1-100,000 buffered w/ 8.4% NaHCO3 Punch size:  4 mm Suture size:  5-0 Suture type: fast-absorbing plain gut   Hemostasis achieved with: suture, pressure and aluminum chloride   Outcome: patient tolerated procedure well   Post-procedure details:  sterile dressing applied and wound care instructions given   Dressing type: bandage, petrolatum and pressure dressing   - Recommend decolonizing for MRSA, will send mupirocin  Skin cancer screening performed today.  Actinic Damage - Chronic condition, secondary to cumulative UV/sun exposure - diffuse scaly erythematous macules with underlying dyspigmentation - Recommend daily broad spectrum sunscreen SPF 30+ to sun-exposed areas, reapply every 2 hours as needed.  - Staying in the shade or wearing long sleeves, sun glasses (UVA+UVB protection) and wide brim hats (4-inch brim around the entire circumference of the hat) are also recommended for sun protection.  - Call for new or changing lesions.  Lentigines, Seborrheic Keratoses, Hemangiomas - Benign normal skin lesions - Benign-appearing - Call for any changes  Melanocytic Nevi - Tan-brown and/or pink-flesh-colored symmetric macules and papules - Benign appearing on exam today - Observation - Call clinic for new or changing moles - Recommend daily use of broad spectrum spf 30+ sunscreen to sun-exposed areas.    Return in about 1 year (around 06/02/2024) for UBSE.  I, Joanie Coddington, CMA, am acting as scribe for Gwenith Daily, MD .   Documentation: I have reviewed the above documentation for accuracy and completeness, and I agree with the above.  Gwenith Daily, MD

## 2023-06-04 ENCOUNTER — Other Ambulatory Visit (HOSPITAL_BASED_OUTPATIENT_CLINIC_OR_DEPARTMENT_OTHER): Payer: Self-pay

## 2023-06-05 ENCOUNTER — Other Ambulatory Visit (HOSPITAL_BASED_OUTPATIENT_CLINIC_OR_DEPARTMENT_OTHER): Payer: Self-pay

## 2023-06-05 LAB — SURGICAL PATHOLOGY

## 2023-06-07 ENCOUNTER — Encounter (HOSPITAL_BASED_OUTPATIENT_CLINIC_OR_DEPARTMENT_OTHER): Payer: Self-pay

## 2023-06-07 ENCOUNTER — Other Ambulatory Visit (HOSPITAL_BASED_OUTPATIENT_CLINIC_OR_DEPARTMENT_OTHER): Payer: Self-pay

## 2023-06-09 ENCOUNTER — Other Ambulatory Visit (HOSPITAL_BASED_OUTPATIENT_CLINIC_OR_DEPARTMENT_OTHER): Payer: Self-pay

## 2023-06-10 ENCOUNTER — Other Ambulatory Visit (HOSPITAL_BASED_OUTPATIENT_CLINIC_OR_DEPARTMENT_OTHER): Payer: Self-pay

## 2023-06-11 ENCOUNTER — Other Ambulatory Visit (HOSPITAL_BASED_OUTPATIENT_CLINIC_OR_DEPARTMENT_OTHER): Payer: Self-pay

## 2023-06-11 ENCOUNTER — Other Ambulatory Visit: Payer: Self-pay

## 2023-06-13 ENCOUNTER — Encounter (HOSPITAL_BASED_OUTPATIENT_CLINIC_OR_DEPARTMENT_OTHER): Payer: Self-pay

## 2023-06-14 ENCOUNTER — Other Ambulatory Visit (HOSPITAL_BASED_OUTPATIENT_CLINIC_OR_DEPARTMENT_OTHER): Payer: Self-pay

## 2023-06-21 ENCOUNTER — Other Ambulatory Visit (HOSPITAL_BASED_OUTPATIENT_CLINIC_OR_DEPARTMENT_OTHER): Payer: Self-pay

## 2023-06-24 ENCOUNTER — Other Ambulatory Visit (HOSPITAL_BASED_OUTPATIENT_CLINIC_OR_DEPARTMENT_OTHER): Payer: Self-pay

## 2023-06-24 ENCOUNTER — Other Ambulatory Visit: Payer: Self-pay

## 2023-07-01 ENCOUNTER — Other Ambulatory Visit (HOSPITAL_COMMUNITY): Payer: Self-pay

## 2023-07-03 ENCOUNTER — Encounter (HOSPITAL_BASED_OUTPATIENT_CLINIC_OR_DEPARTMENT_OTHER): Payer: Self-pay

## 2023-07-03 ENCOUNTER — Other Ambulatory Visit (HOSPITAL_BASED_OUTPATIENT_CLINIC_OR_DEPARTMENT_OTHER): Payer: Self-pay

## 2023-07-09 ENCOUNTER — Other Ambulatory Visit (HOSPITAL_BASED_OUTPATIENT_CLINIC_OR_DEPARTMENT_OTHER): Payer: Self-pay

## 2023-07-16 ENCOUNTER — Telehealth: Payer: Self-pay

## 2023-07-16 ENCOUNTER — Other Ambulatory Visit (HOSPITAL_COMMUNITY): Payer: Self-pay

## 2023-07-16 NOTE — Telephone Encounter (Signed)
Pharmacy Patient Advocate Encounter   Received notification from CoverMyMeds that prior authorization for Zepbound 2.5MG /0.5ML pen-injectors is required/requested.   Insurance verification completed.   The patient is insured through [facility] .   Per test claim: Refill too soon. PA is not needed at this time. Medication was filled 07/09/2023. Next eligible fill date is 07/30/2023.  NOTE THAT PT CALLED INSURANCE TO GAIN OVERRIDE FOR PHARMACY, OVERRIDE MIGHT BE NEEDED FOR FUTURE FILLS

## 2023-07-20 ENCOUNTER — Other Ambulatory Visit (HOSPITAL_BASED_OUTPATIENT_CLINIC_OR_DEPARTMENT_OTHER): Payer: Self-pay

## 2023-07-23 ENCOUNTER — Encounter: Payer: Self-pay | Admitting: Gastroenterology

## 2023-07-29 ENCOUNTER — Ambulatory Visit (INDEPENDENT_AMBULATORY_CARE_PROVIDER_SITE_OTHER): Payer: Commercial Managed Care - PPO | Admitting: Gastroenterology

## 2023-07-29 ENCOUNTER — Other Ambulatory Visit: Payer: Self-pay | Admitting: Internal Medicine

## 2023-07-29 ENCOUNTER — Other Ambulatory Visit (HOSPITAL_BASED_OUTPATIENT_CLINIC_OR_DEPARTMENT_OTHER): Payer: Self-pay

## 2023-07-29 ENCOUNTER — Encounter: Payer: Self-pay | Admitting: Gastroenterology

## 2023-07-29 VITALS — BP 110/70 | HR 73 | Ht 70.25 in | Wt 228.0 lb

## 2023-07-29 DIAGNOSIS — K625 Hemorrhage of anus and rectum: Secondary | ICD-10-CM | POA: Diagnosis not present

## 2023-07-29 DIAGNOSIS — E65 Localized adiposity: Secondary | ICD-10-CM

## 2023-07-29 DIAGNOSIS — Z8719 Personal history of other diseases of the digestive system: Secondary | ICD-10-CM

## 2023-07-29 MED ORDER — ZEPBOUND 2.5 MG/0.5ML ~~LOC~~ SOAJ
SUBCUTANEOUS | 3 refills | Status: DC
Start: 2023-07-29 — End: 2023-07-31
  Filled 2023-07-29 – 2023-07-30 (×2): qty 2, 28d supply, fill #0

## 2023-07-29 NOTE — Patient Instructions (Addendum)
_______________________________________________________  If your blood pressure at your visit was 140/90 or greater, please contact your primary care physician to follow up on this.  _______________________________________________________  If you are age 39 or older, your body mass index should be between 23-30. Your Body mass index is 32.48 kg/m. If this is out of the aforementioned range listed, please consider follow up with your Primary Care Provider.  If you are age 33 or younger, your body mass index should be between 19-25. Your Body mass index is 32.48 kg/m. If this is out of the aformentioned range listed, please consider follow up with your Primary Care Provider.   ________________________________________________________  The [facility] providers would like to encourage you to use [facility] to communicate with providers for non-urgent requests or questions.  Due to long hold times on the telephone, sending your provider a message by [facility] may be a faster and more efficient way to get a response.  Please allow 48 business hours for a response.  Please remember that this is for non-urgent requests.  _______________________________________________________  Bonita Quin have been scheduled for a colonoscopy. Please follow written instructions given to you at your visit today.   Please pick up your prep supplies at the pharmacy within the next 1-3 days.  If you use inhalers (even only as needed), please bring them with you on the day of your procedure.  DO NOT TAKE 7 DAYS PRIOR TO TEST- Trulicity (dulaglutide) Ozempic, Wegovy (semaglutide) Mounjaro (tirzepatide) Bydureon Bcise (exanatide extended release)  DO NOT TAKE 1 DAY PRIOR TO YOUR TEST Rybelsus (semaglutide) Adlyxin (lixisenatide) Victoza (liraglutide) Byetta (exanatide) ___________________________________________________________________________  Thank you,  Dr. Lynann Bologna

## 2023-07-29 NOTE — Progress Notes (Signed)
Chief Complaint: rectal bleeding  Referring Provider:  Lula Olszewski, MD      ASSESSMENT AND PLAN;   #1. Rectal bleeding. CT AP 01/2023 showing mild sig div. Nl CBC   Plan: -Colon    Discussed risks & benefits of colonoscopy. Risks including rare perforation req laparotomy, bleeding after bx/polypectomy req blood transfusion, rarely missing neoplasms, risks of anesthesia/sedation, rare risk of damage to internal organs. Benefits outweigh the risks. Patient agrees to proceed. All the questions were answered. Pt consents to proceed. HPI:    Spencer Phillips is a 37 y.o. male   Discussed the use of AI scribe software for clinical note transcription with the patient, who gave verbal consent to proceed.  History of Present Illness   The patient presents with intermittent rectal bleeding for the past four to five years. The bleeding is described as red, sometimes mixed in the stool, and sometimes noticed on the toilet paper. They occasionally experience rectal pain during bowel movements, describing it as a feeling of tightness. They also report a sensation of something coming out and then retracting back in. They have tried over-the-counter creams and suppositories, such as Preparation H, which provide temporary relief.  The patient also mentions a discomfort in the abdominal region, suspected to be a hernia. They have had a CT scan for lymphadenopathy and groin pain, which revealed small kidney stones on the right side, diverticulosis, and a small amount of plaque in the aorta. The patient also has a history of folliculitis, diagnosed via a biopsy.  The patient has been managing these symptoms while maintaining their regular activities. They have not experienced any significant weight loss. They admit to not drinking enough water and have been advised to increase their intake.   Occ constipation.  He denies having any nausea, vomiting, heartburn, regurgitation, odynophagia or  dysphagia.  No recent weight loss.  No family history of colon cancer or colon polyps but he will find out more.      Prev GI WU:  CT AP 02/13/2023 1. Nonobstructive 1-2 mm nephrolithiasis bilaterally. 2. Colonic diverticulosis with no acute diverticulitis. 3. Tiny fat containing right inguinal hernia. Past Medical History:  Diagnosis Date   Blood in stool    a few times a year. never evaluated.    Enlarged lymph nodes in armpit 02/07/2023   February 17, 2023 MyChart comments:   XR abnormal->02/17/23 CT scan results: found no lymphadenopathy      02/07/2023      Really noticed in the past year  Flares sometimes, other times feels like nothing and not very sensitive. Other times its hard to even put his arm down its so sensitive.  Mostly in past year, prior to that he would occasionally feel something in the axilla.  For the past year its be    Past Surgical History:  Procedure Laterality Date   WISDOM TOOTH EXTRACTION     had anesthesia    Family History  Problem Relation Age of Onset   Healthy Mother    Depression Father        on rx   Healthy Sister    Healthy Brother    Breast cancer Maternal Grandmother        late 69s   Stroke Maternal Grandfather        died 56, smoker earlier in life   Other Paternal Grandfather        hit by car   ADD / ADHD Son  Colon cancer Neg Hx    Stomach cancer Neg Hx    Esophageal cancer Neg Hx     Social History   Tobacco Use   Smoking status: Never   Smokeless tobacco: Never  Vaping Use   Vaping status: Never Used  Substance Use Topics   Alcohol use: Yes    Alcohol/week: 5.0 standard drinks of alcohol    Types: 5 Standard drinks or equivalent per week    Comment: socially   Drug use: No    Current Outpatient Medications  Medication Sig Dispense Refill   rosuvastatin (CRESTOR) 20 MG tablet Take 1 tablet (20 mg total) by mouth daily. 90 tablet 3   tirzepatide (ZEPBOUND) 2.5 MG/0.5ML Pen Inject 2.5 mg into the skin once a week for  28 days 2 mL 3   Misc. Devices (STRAINER/STAINLESS STEEL/2.5") MISC 1 Device by Does not apply route daily at 6 (six) AM. (Patient not taking: Reported on 07/29/2023) 1 each 0   mupirocin ointment (BACTROBAN) 2 % Apply 1 Application topically 3 (three) times daily. Inside nostrils x 7 days (Patient not taking: Reported on 07/29/2023) 22 g 0   No current facility-administered medications for this visit.    Allergies  Allergen Reactions   Penicillins Hives    Review of Systems:  Constitutional: Denies fever, chills, diaphoresis, appetite change and fatigue.  HEENT: Denies photophobia, eye pain, redness, hearing loss, ear pain, congestion, sore throat, rhinorrhea, sneezing, mouth sores, neck pain, neck stiffness and tinnitus.   Respiratory: Denies SOB, DOE, cough, chest tightness,  and wheezing.   Cardiovascular: Denies chest pain, palpitations and leg swelling.  Genitourinary: Denies dysuria, urgency, frequency, hematuria, flank pain and difficulty urinating.  Musculoskeletal: Denies myalgias, back pain, joint swelling, arthralgias and gait problem.  Skin: No rash.  Neurological: Denies dizziness, seizures, syncope, weakness, light-headedness, numbness and headaches.  Hematological: Denies adenopathy. Easy bruising, personal or family bleeding history  Psychiatric/Behavioral: No anxiety or depression     Physical Exam:    BP 110/70   Pulse 73   Ht 5' 10.25" (1.784 m)   Wt 228 lb (103.4 kg)   BMI 32.48 kg/m  Wt Readings from Last 3 Encounters:  07/29/23 228 lb (103.4 kg)  03/29/23 228 lb (103.4 kg)  02/21/23 231 lb (104.8 kg)   Constitutional:  Well-developed, in no acute distress. Psychiatric: Normal mood and affect. Behavior is normal. HEENT: Pupils normal.  Conjunctivae are normal. No scleral icterus. Cardiovascular: Normal rate, regular rhythm. No edema Pulmonary/chest: Effort normal and breath sounds normal. No wheezing, rales or rhonchi. Abdominal: Soft, nondistended.  Nontender. Bowel sounds active throughout. There are no masses palpable. No hepatomegaly. Small R inguinal hernia. Rectal: Deferred-to be performed at the time of colonoscopy. Neurological: Alert and oriented to person place and time. Skin: Skin is warm and dry. No rashes noted.  Data Reviewed: I have personally reviewed following labs and imaging studies  CBC:    Latest Ref Rng & Units 02/07/2023   12:06 PM 11/08/2008    6:46 PM  CBC  WBC 4.0 - 10.5 K/uL 8.6    Hemoglobin 13.0 - 17.0 g/dL 13.2  44.0   Hematocrit 39.0 - 52.0 % 46.1  50.0   Platelets 150.0 - 400.0 K/uL 317.0      CMP:    Latest Ref Rng & Units 02/07/2023   12:06 PM 11/08/2008    6:46 PM  CMP  Glucose 70 - 99 mg/dL 86  102   BUN 6 - 23 mg/dL  15  16   Creatinine 0.40 - 1.50 mg/dL 1.61  1.0   Sodium 096 - 145 mEq/L 139  140   Potassium 3.5 - 5.1 mEq/L 4.1  3.9   Chloride 96 - 112 mEq/L 103  104   CO2 19 - 32 mEq/L 27    Calcium 8.4 - 10.5 mg/dL 9.3    Total Protein 6.0 - 8.3 g/dL 6.9    Total Bilirubin 0.2 - 1.2 mg/dL 0.7    Alkaline Phos 39 - 117 U/L 79    AST 0 - 37 U/L 18    ALT 0 - 53 U/L 24         Edman Circle, MD 07/29/2023, 2:35 PM  Cc: Lula Olszewski, MD

## 2023-07-30 ENCOUNTER — Other Ambulatory Visit: Payer: Self-pay

## 2023-07-30 ENCOUNTER — Encounter (HOSPITAL_BASED_OUTPATIENT_CLINIC_OR_DEPARTMENT_OTHER): Payer: Self-pay

## 2023-07-30 ENCOUNTER — Other Ambulatory Visit (HOSPITAL_BASED_OUTPATIENT_CLINIC_OR_DEPARTMENT_OTHER): Payer: Self-pay

## 2023-07-30 ENCOUNTER — Telehealth: Payer: Self-pay | Admitting: Internal Medicine

## 2023-07-30 DIAGNOSIS — E65 Localized adiposity: Secondary | ICD-10-CM

## 2023-07-30 MED ORDER — ZEPBOUND 5 MG/0.5ML ~~LOC~~ SOAJ
5.0000 mg | SUBCUTANEOUS | 2 refills | Status: DC
  Filled 2023-07-30 – 2023-08-05 (×2): qty 2, 28d supply, fill #0

## 2023-07-30 NOTE — Telephone Encounter (Signed)
Prescription Request  07/30/2023  LOV: 03/29/2023  What is the name of the medication or equipment?  rosuvastatin (CRESTOR) 20 MG tablet   tirzepatide (ZEPBOUND) 2.5 MG/0.5ML Pen    Have you contacted your pharmacy to request a refill? Yes   Which pharmacy would you like this sent to?  [facility] - [place], [place] - [address] [phone] [address] [place] [address]    Patient notified that their request is being sent to the clinical staff for review and that they should receive a response within 2 business days.   Please advise at Mobile [phone] (mobile)

## 2023-07-31 ENCOUNTER — Other Ambulatory Visit (HOSPITAL_BASED_OUTPATIENT_CLINIC_OR_DEPARTMENT_OTHER): Payer: Self-pay

## 2023-07-31 MED ORDER — TIRZEPATIDE-WEIGHT MANAGEMENT 10 MG/0.5ML ~~LOC~~ SOAJ: 10.0000 mg | SUBCUTANEOUS | 0 refills | Status: DC

## 2023-07-31 MED ORDER — TIRZEPATIDE-WEIGHT MANAGEMENT 15 MG/0.5ML ~~LOC~~ SOAJ: 15.0000 mg | SUBCUTANEOUS | 0 refills | Status: DC

## 2023-07-31 MED ORDER — TIRZEPATIDE-WEIGHT MANAGEMENT 12.5 MG/0.5ML ~~LOC~~ SOAJ: 12.5000 mg | SUBCUTANEOUS | 0 refills | Status: DC

## 2023-07-31 MED ORDER — TIRZEPATIDE-WEIGHT MANAGEMENT 7.5 MG/0.5ML ~~LOC~~ SOAJ
7.5000 mg | SUBCUTANEOUS | 0 refills | Status: DC
  Filled 2023-08-08 – 2023-09-20 (×4): qty 2, 28d supply, fill #0

## 2023-07-31 NOTE — Telephone Encounter (Signed)
Spoke with patient yesterday concerning this. Sent in the next dosage of Zepbound 5 mg to [facility] pharmacy at [facility] and kept the rosuvastatin at the same pharmacy per patient's request (already has a year supply of this). Verified that this pharmacy had the Zepbound in stock first.

## 2023-08-05 ENCOUNTER — Other Ambulatory Visit (HOSPITAL_BASED_OUTPATIENT_CLINIC_OR_DEPARTMENT_OTHER): Payer: Self-pay

## 2023-08-08 ENCOUNTER — Other Ambulatory Visit (HOSPITAL_BASED_OUTPATIENT_CLINIC_OR_DEPARTMENT_OTHER): Payer: Self-pay

## 2023-08-09 ENCOUNTER — Other Ambulatory Visit: Payer: Self-pay

## 2023-08-09 ENCOUNTER — Other Ambulatory Visit (HOSPITAL_BASED_OUTPATIENT_CLINIC_OR_DEPARTMENT_OTHER): Payer: Self-pay

## 2023-08-13 ENCOUNTER — Other Ambulatory Visit (HOSPITAL_BASED_OUTPATIENT_CLINIC_OR_DEPARTMENT_OTHER): Payer: Self-pay

## 2023-08-19 ENCOUNTER — Other Ambulatory Visit (HOSPITAL_BASED_OUTPATIENT_CLINIC_OR_DEPARTMENT_OTHER): Payer: Self-pay

## 2023-08-20 ENCOUNTER — Other Ambulatory Visit: Payer: Self-pay

## 2023-09-20 ENCOUNTER — Other Ambulatory Visit (HOSPITAL_BASED_OUTPATIENT_CLINIC_OR_DEPARTMENT_OTHER): Payer: Self-pay

## 2023-09-21 ENCOUNTER — Other Ambulatory Visit (HOSPITAL_BASED_OUTPATIENT_CLINIC_OR_DEPARTMENT_OTHER): Payer: Self-pay

## 2023-09-29 ENCOUNTER — Other Ambulatory Visit (HOSPITAL_BASED_OUTPATIENT_CLINIC_OR_DEPARTMENT_OTHER): Payer: Self-pay

## 2023-10-01 ENCOUNTER — Other Ambulatory Visit (HOSPITAL_BASED_OUTPATIENT_CLINIC_OR_DEPARTMENT_OTHER): Payer: Self-pay

## 2023-10-02 ENCOUNTER — Other Ambulatory Visit (HOSPITAL_BASED_OUTPATIENT_CLINIC_OR_DEPARTMENT_OTHER): Payer: Self-pay

## 2023-10-20 ENCOUNTER — Encounter: Payer: Self-pay | Admitting: Certified Registered Nurse Anesthetist

## 2023-10-22 ENCOUNTER — Encounter: Payer: Self-pay | Admitting: Gastroenterology

## 2023-10-22 ENCOUNTER — Ambulatory Visit (AMBULATORY_SURGERY_CENTER): Payer: No Typology Code available for payment source | Admitting: Gastroenterology

## 2023-10-22 VITALS — BP 94/59 | HR 58 | Temp 98.0°F | Resp 15 | Ht 70.0 in | Wt 228.0 lb

## 2023-10-22 DIAGNOSIS — K5140 Inflammatory polyps of colon without complications: Secondary | ICD-10-CM

## 2023-10-22 DIAGNOSIS — K625 Hemorrhage of anus and rectum: Secondary | ICD-10-CM | POA: Diagnosis not present

## 2023-10-22 DIAGNOSIS — D128 Benign neoplasm of rectum: Secondary | ICD-10-CM

## 2023-10-22 DIAGNOSIS — K5730 Diverticulosis of large intestine without perforation or abscess without bleeding: Secondary | ICD-10-CM

## 2023-10-22 DIAGNOSIS — K602 Anal fissure, unspecified: Secondary | ICD-10-CM | POA: Diagnosis not present

## 2023-10-22 DIAGNOSIS — K640 First degree hemorrhoids: Secondary | ICD-10-CM | POA: Diagnosis not present

## 2023-10-22 DIAGNOSIS — K635 Polyp of colon: Secondary | ICD-10-CM | POA: Diagnosis not present

## 2023-10-22 MED ORDER — SODIUM CHLORIDE 0.9 % IV SOLN: 500.0000 mL | Freq: Once | INTRAVENOUS | Status: AC

## 2023-10-22 NOTE — Patient Instructions (Signed)
 Discharge instructions given. Handouts on polyps,Diverticulosis and Hemorrhoids. Resume previous medications. Use Benefiber ONE TEASPOON BY MOUTH DAILY WITH 8 OUNCES OF WATER. Use preparation H 1 twice daily after the bowel movement for 7 to 10 days and then as needed. YOU HAD AN ENDOSCOPIC PROCEDURE TODAY AT THE [facility]:   Refer to the procedure report that was given to you for any specific questions about what was found during the examination.  If the procedure report does not answer your questions, please call your gastroenterologist to clarify.  If you requested that your care partner not be given the details of your procedure findings, then the procedure report has been included in a sealed envelope for you to review at your convenience later.  YOU SHOULD EXPECT: Some feelings of bloating in the abdomen. Passage of more gas than usual.  Walking can help get rid of the air that was put into your GI tract during the procedure and reduce the bloating. If you had a lower endoscopy (such as a colonoscopy or flexible sigmoidoscopy) you may notice spotting of blood in your stool or on the toilet paper. If you underwent a bowel prep for your procedure, you may not have a normal bowel movement for a few days.  Please Note:  You might notice some irritation and congestion in your nose or some drainage.  This is from the oxygen used during your procedure.  There is no need for concern and it should clear up in a day or so.  SYMPTOMS TO REPORT IMMEDIATELY:  Following lower endoscopy (colonoscopy or flexible sigmoidoscopy):  Excessive amounts of blood in the stool  Significant tenderness or worsening of abdominal pains  Swelling of the abdomen that is new, acute  Fever of 100F or higher   Black, tarry-looking stools  For urgent or emergent issues, a gastroenterologist can be reached at any hour by calling [phone]. Do not use MyChart messaging for urgent concerns.    DIET:   We do recommend a small meal at first, but then you may proceed to your regular diet.  Drink plenty of fluids but you should avoid alcoholic beverages for 24 hours.  ACTIVITY:  You should plan to take it easy for the rest of today and you should NOT DRIVE or use heavy machinery until tomorrow (because of the sedation medicines used during the test).    FOLLOW UP: Our staff will call the number listed on your records the next business day following your procedure.  We will call around 7:15- 8:00 am to check on you and address any questions or concerns that you may have regarding the information given to you following your procedure. If we do not reach you, we will leave a message.     If any biopsies were taken you will be contacted by phone or by letter within the next 1-3 weeks.  Please call us at [phone] if you have not heard about the biopsies in 3 weeks.    SIGNATURES/CONFIDENTIALITY: You and/or your care partner have signed paperwork which will be entered into your electronic medical record.  These signatures attest to the fact that that the information above on your After Visit Summary has been reviewed and is understood.  Full responsibility of the confidentiality of this discharge information lies with you and/or your care-partner.

## 2023-10-22 NOTE — Op Note (Signed)
 [facility] Patient Name: Spencer Phillips Procedure Date: 10/22/2023 8:21 AM MRN: [id number] Endoscopist: Lynann Bologna , MD, [id number] Age: 38 Referring MD:  Date of Birth: 01/28/1986 Gender: Male Account #: [id number] Procedure:                Colonoscopy Indications:              Rectal bleeding with neg CT AP, Nl CBC. Medicines:                Monitored Anesthesia Care Procedure:                Pre-Anesthesia Assessment:                           - Prior to the procedure, a History and Physical                            was performed, and patient medications and                            allergies were reviewed. The patient's tolerance of                            previous anesthesia was also reviewed. The risks                            and benefits of the procedure and the sedation                            options and risks were discussed with the patient.                            All questions were answered, and informed consent                            was obtained. Prior Anticoagulants: The patient has                            taken no anticoagulant or antiplatelet agents. ASA                            Grade Assessment: II - A patient with mild systemic                            disease. After reviewing the risks and benefits,                            the patient was deemed in satisfactory condition to                            undergo the procedure.                           After obtaining informed consent, the colonoscope  was passed under direct vision. Throughout the                            procedure, the patient's blood pressure, pulse, and                            oxygen saturations were monitored continuously. The                            Olympus Scope SN: [id number] was introduced through                            the anus and advanced to the 4 cm into the ileum.                            The colonoscopy was  performed without difficulty.                            The patient tolerated the procedure well. The                            quality of the bowel preparation was good. The                            terminal ileum, ileocecal valve, appendiceal                            orifice, and rectum were photographed. Scope In: 8:34:12 AM Scope Out: 8:49:55 AM Scope Withdrawal Time: 0 hours 11 minutes 43 seconds  Total Procedure Duration: 0 hours 15 minutes 43 seconds  Findings:                 Two sessile polyps were found in the mid rectum and                            mid sigmoid colon. The polyps were 4 to 6 mm in                            size. These polyps were removed with a cold snare.                            Resection and retrieval were complete.                           Multiple medium-mouthed diverticula were found in                            the sigmoid colon and few in descending colon.                           Non-bleeding internal hemorrhoids were found during  retroflexion. The hemorrhoids were moderate and                            Grade I (internal hemorrhoids that do not                            prolapse). A small healed posterior anal fissure                            was noted.                           The terminal ileum appeared normal.                           The exam was otherwise without abnormality on                            direct and retroflexion views. Complications:            No immediate complications. Estimated Blood Loss:     Estimated blood loss: none. Impression:               - Two 4 to 6 mm polyps in the mid rectum and in the                            mid sigmoid colon, removed with a cold snare.                            Resected and retrieved.                           - Moderate predominantly sigmoid diverticulosis.                           - Non-bleeding internal hemorrhoids.                           -  The examined portion of the ileum was normal.                           - The examination was otherwise normal on direct                            and retroflexion views. Recommendation:           - Patient has a contact number available for                            emergencies. The signs and symptoms of potential                            delayed complications were discussed with the                            patient. Return to normal activities tomorrow.  Written discharge instructions were provided to the                            patient.                           - Resume previous diet.                           - Use Benefiber one teaspoon PO daily with 8 ounces                            of water.                           - Await pathology results.                           - Use Preparation H 1 twice daily after the bowel                            movement for 7 to 10 days and then as needed.                           - Repeat colonoscopy for surveillance based on                            pathology results.                           - The findings and recommendations were discussed                            with the patient's family. Lynann Bologna, MD 10/22/2023 8:55:26 AM This report has been signed electronically.

## 2023-10-22 NOTE — Progress Notes (Signed)
 Called to room to assist during endoscopic procedure.  Patient ID and intended procedure confirmed with present staff. Received instructions for my participation in the procedure from the performing physician.

## 2023-10-22 NOTE — Progress Notes (Signed)
 Report given to PACU, vss

## 2023-10-22 NOTE — Progress Notes (Signed)
 Chief Complaint: rectal bleeding  Referring Provider:  Lula Olszewski, MD      ASSESSMENT AND PLAN;   #1. Rectal bleeding. CT AP 01/2023 showing mild sig div. Nl CBC   Plan: -Colon    Discussed risks & benefits of colonoscopy. Risks including rare perforation req laparotomy, bleeding after bx/polypectomy req blood transfusion, rarely missing neoplasms, risks of anesthesia/sedation, rare risk of damage to internal organs. Benefits outweigh the risks. Patient agrees to proceed. All the questions were answered. Pt consents to proceed. HPI:    Spencer Phillips is a 38 y.o. male   Discussed the use of AI scribe software for clinical note transcription with the patient, who gave verbal consent to proceed.  History of Present Illness   The patient presents with intermittent rectal bleeding for the past four to five years. The bleeding is described as red, sometimes mixed in the stool, and sometimes noticed on the toilet paper. They occasionally experience rectal pain during bowel movements, describing it as a feeling of tightness. They also report a sensation of something coming out and then retracting back in. They have tried over-the-counter creams and suppositories, such as Preparation H, which provide temporary relief.  The patient also mentions a discomfort in the abdominal region, suspected to be a hernia. They have had a CT scan for lymphadenopathy and groin pain, which revealed small kidney stones on the right side, diverticulosis, and a small amount of plaque in the aorta. The patient also has a history of folliculitis, diagnosed via a biopsy.  The patient has been managing these symptoms while maintaining their regular activities. They have not experienced any significant weight loss. They admit to not drinking enough water and have been advised to increase their intake.   Occ constipation.  He denies having any nausea, vomiting, heartburn, regurgitation, odynophagia or  dysphagia.  No recent weight loss.  No family history of colon cancer or colon polyps but he will find out more.      Prev GI WU:  CT AP 02/13/2023 1. Nonobstructive 1-2 mm nephrolithiasis bilaterally. 2. Colonic diverticulosis with no acute diverticulitis. 3. Tiny fat containing right inguinal hernia. Past Medical History:  Diagnosis Date   Blood in stool    a few times a year. never evaluated.    Enlarged lymph nodes in armpit 02/07/2023   February 17, 2023 MyChart comments:   XR abnormal->02/17/23 CT scan results: found no lymphadenopathy      02/07/2023      Really noticed in the past year  Flares sometimes, other times feels like nothing and not very sensitive. Other times its hard to even put his arm down its so sensitive.  Mostly in past year, prior to that he would occasionally feel something in the axilla.  For the past year its be   Hyperlipidemia     Past Surgical History:  Procedure Laterality Date   WISDOM TOOTH EXTRACTION     had anesthesia    Family History  Problem Relation Age of Onset   Healthy Mother    Depression Father        on rx   Healthy Sister    Healthy Brother    Breast cancer Maternal Grandmother        late 74s   Stroke Maternal Grandfather        died 57, smoker earlier in life   Other Paternal Grandfather        hit by car   ADD /  ADHD Son    Colon cancer Neg Hx    Stomach cancer Neg Hx    Esophageal cancer Neg Hx     Social History   Tobacco Use   Smoking status: Never   Smokeless tobacco: Never  Vaping Use   Vaping status: Never Used  Substance Use Topics   Alcohol use: Yes    Alcohol/week: 5.0 standard drinks of alcohol    Types: 5 Standard drinks or equivalent per week    Comment: socially   Drug use: No    Current Outpatient Medications  Medication Sig Dispense Refill   rosuvastatin (CRESTOR) 20 MG tablet Take 1 tablet (20 mg total) by mouth daily. 90 tablet 3   tirzepatide (ZEPBOUND) 10 MG/0.5ML Pen Inject 10 mg into the skin  once a week. 2 mL 0   tirzepatide (ZEPBOUND) 12.5 MG/0.5ML Pen Inject 12.5 mg into the skin once a week. 2 mL 0   tirzepatide (ZEPBOUND) 15 MG/0.5ML Pen Inject 15 mg into the skin once a week. 2 mL 0   tirzepatide (ZEPBOUND) 5 MG/0.5ML Pen Inject 5 mg into the skin once a week. 2 mL 2   tirzepatide (ZEPBOUND) 7.5 MG/0.5ML Pen Inject 7.5 mg into the skin once a week. 2 mL 0   Current Facility-Administered Medications  Medication Dose Route Frequency Provider Last Rate Last Admin   0.9 %  sodium chloride infusion  500 mL Intravenous Once Lynann Bologna, MD        Allergies  Allergen Reactions   Penicillins Hives    Review of Systems:  Constitutional: Denies fever, chills, diaphoresis, appetite change and fatigue.  HEENT: Denies photophobia, eye pain, redness, hearing loss, ear pain, congestion, sore throat, rhinorrhea, sneezing, mouth sores, neck pain, neck stiffness and tinnitus.   Respiratory: Denies SOB, DOE, cough, chest tightness,  and wheezing.   Cardiovascular: Denies chest pain, palpitations and leg swelling.  Genitourinary: Denies dysuria, urgency, frequency, hematuria, flank pain and difficulty urinating.  Musculoskeletal: Denies myalgias, back pain, joint swelling, arthralgias and gait problem.  Skin: No rash.  Neurological: Denies dizziness, seizures, syncope, weakness, light-headedness, numbness and headaches.  Hematological: Denies adenopathy. Easy bruising, personal or family bleeding history  Psychiatric/Behavioral: No anxiety or depression     Physical Exam:    BP 124/69   Pulse 64   Temp 98 F (36.7 C)   Ht 5\' 10"  (1.778 m)   Wt 228 lb (103.4 kg)   SpO2 97%   BMI 32.71 kg/m  Wt Readings from Last 3 Encounters:  10/22/23 228 lb (103.4 kg)  07/29/23 228 lb (103.4 kg)  03/29/23 228 lb (103.4 kg)   Constitutional:  Well-developed, in no acute distress. Psychiatric: Normal mood and affect. Behavior is normal. HEENT: Pupils normal.  Conjunctivae are normal. No  scleral icterus. Cardiovascular: Normal rate, regular rhythm. No edema Pulmonary/chest: Effort normal and breath sounds normal. No wheezing, rales or rhonchi. Abdominal: Soft, nondistended. Nontender. Bowel sounds active throughout. There are no masses palpable. No hepatomegaly. Small R inguinal hernia. Rectal: Deferred-to be performed at the time of colonoscopy. Neurological: Alert and oriented to person place and time. Skin: Skin is warm and dry. No rashes noted.  Data Reviewed: I have personally reviewed following labs and imaging studies  CBC:    Latest Ref Rng & Units 02/07/2023   12:06 PM 11/08/2008    6:46 PM  CBC  WBC 4.0 - 10.5 K/uL 8.6    Hemoglobin 13.0 - 17.0 g/dL 29.5  28.4   Hematocrit  39.0 - 52.0 % 46.1  50.0   Platelets 150.0 - 400.0 K/uL 317.0      CMP:    Latest Ref Rng & Units 02/07/2023   12:06 PM 11/08/2008    6:46 PM  CMP  Glucose 70 - 99 mg/dL 86  829   BUN 6 - 23 mg/dL 15  16   Creatinine 5.62 - 1.50 mg/dL 1.30  1.0   Sodium 865 - 145 mEq/L 139  140   Potassium 3.5 - 5.1 mEq/L 4.1  3.9   Chloride 96 - 112 mEq/L 103  104   CO2 19 - 32 mEq/L 27    Calcium 8.4 - 10.5 mg/dL 9.3    Total Protein 6.0 - 8.3 g/dL 6.9    Total Bilirubin 0.2 - 1.2 mg/dL 0.7    Alkaline Phos 39 - 117 U/L 79    AST 0 - 37 U/L 18    ALT 0 - 53 U/L 24         Edman Circle, MD 10/22/2023, 8:27 AM  Cc: Lula Olszewski, MD

## 2023-10-23 ENCOUNTER — Telehealth: Payer: Self-pay | Admitting: *Deleted

## 2023-10-23 NOTE — Telephone Encounter (Signed)
  Follow up Call-     10/22/2023    8:00 AM  Call back number  Post procedure Call Back phone  # [phone]  Permission to leave phone message Yes     Patient questions:  Do you have a fever, pain , or abdominal swelling? No. Pain Score  0 *  Have you tolerated food without any problems? Yes.    Have you been able to return to your normal activities? Yes.    Do you have any questions about your discharge instructions: Diet   No. Medications  No. Follow up visit  No.  Do you have questions or concerns about your Care? No.  Actions: * If pain score is 4 or above: No action needed, pain <4.

## 2023-10-24 LAB — SURGICAL PATHOLOGY

## 2023-10-30 ENCOUNTER — Encounter: Payer: Self-pay | Admitting: Gastroenterology

## 2023-12-23 ENCOUNTER — Other Ambulatory Visit (HOSPITAL_BASED_OUTPATIENT_CLINIC_OR_DEPARTMENT_OTHER): Payer: Self-pay

## 2023-12-23 ENCOUNTER — Ambulatory Visit (INDEPENDENT_AMBULATORY_CARE_PROVIDER_SITE_OTHER): Admitting: Family

## 2023-12-23 ENCOUNTER — Other Ambulatory Visit (HOSPITAL_COMMUNITY)
Admission: RE | Admit: 2023-12-23 | Discharge: 2023-12-23 | Disposition: A | Discharge status: Home / Self Care | Source: Ambulatory Visit | Attending: Family | Admitting: Family

## 2023-12-23 ENCOUNTER — Telehealth: Admitting: Physician Assistant

## 2023-12-23 VITALS — BP 122/74 | HR 66 | Temp 98.6°F | Wt 134.2 lb

## 2023-12-23 DIAGNOSIS — R3911 Hesitancy of micturition: Secondary | ICD-10-CM

## 2023-12-23 DIAGNOSIS — R350 Frequency of micturition: Secondary | ICD-10-CM

## 2023-12-23 DIAGNOSIS — R399 Unspecified symptoms and signs involving the genitourinary system: Secondary | ICD-10-CM | POA: Insufficient documentation

## 2023-12-23 DIAGNOSIS — R300 Dysuria: Secondary | ICD-10-CM

## 2023-12-23 LAB — POCT URINALYSIS DIPSTICK
Bilirubin, UA: NEGATIVE
Glucose, UA: NEGATIVE
Ketones, UA: NEGATIVE
Leukocytes, UA: NEGATIVE
Nitrite, UA: NEGATIVE
Protein, UA: NEGATIVE
Spec Grav, UA: 1.015 (ref 1.010–1.025)
Urobilinogen, UA: 0.2 U/dL
pH, UA: 6.5 (ref 5.0–8.0)

## 2023-12-23 MED ORDER — TAMSULOSIN HCL 0.4 MG PO CAPS
0.4000 mg | ORAL_CAPSULE | Freq: Every day | ORAL | 0 refills | Status: DC
  Filled 2023-12-23: qty 30, 30d supply, fill #0

## 2023-12-23 NOTE — Progress Notes (Signed)
 Patient ID: Spencer Phillips, male    DOB: 12-04-1985, 38 y.o.   MRN: [id number]  Chief Complaint  Patient presents with   Urinary Tract Infection    Kidney conerns  Discussed the use of AI scribe software for clinical note transcription with the patient, who gave verbal consent to proceed.  History of Present Illness The patient, with a history of small kidney stones found incidentally on imaging, presents with urinary symptoms including frequency, urgency, and dysuria. He denies ever having been treated for stones in past. He reports that these symptoms began last week while traveling and have persisted. He describes an episode of excruciating groin pain last year, which he managed at home with hydration and rest. He has not noticed any blood in his urine and denies any previous treatment for kidney stones. The patient also reports a small hernia in his right groin, which has been more sensitive in the past few days. He describes a dull, achy pain in his lower back that wraps around to the area of the hernia. He denies any rectal pressure or pain. He reports his last bout of pain was this morning, but none since. The patient has no history of urinary tract infections or sexually transmitted diseases. He denies any unusual discharge. He has no known family history of prostate issues.  Assessment & Plan Urinary symptoms possibly due to kidney stones - Intermittent urinary symptoms and trace hematuria suggest possible stone passage. Differential includes nephrolithiasis and prostatic hypertrophy. Discussed tamsulosin for stone passage and symptom relief. - Prescribe tamsulosin 0.4mg  today & tonight and then at bedtime for one week. - Advised monitoring symptoms and consider  ER if severe pain recurs. - Consider ongoing tamsulosin for potential prostatic hypertrophy if symptoms persist or referral to Urology for thorough eval. - Encourage hydration with at least two liters of water  daily. - Discussed lack of evidence supporting cranberry juice for urinary symptoms. - F/U with PCP prn  Inguinal hernia Small inguinal hernia with increased sensitivity and discomfort. No significant change in size or reducibility. Previously advised against surgical intervention for now. - Monitor hernia for changes in size or symptoms   Subjective:    Outpatient Medications Prior to Visit  Medication Sig Dispense Refill   rosuvastatin  (CRESTOR ) 20 MG tablet Take 1 tablet (20 mg total) by mouth daily. 90 tablet 3   tirzepatide  (ZEPBOUND ) 10 MG/0.5ML Pen Inject 10 mg into the skin once a week. 2 mL 0   tirzepatide  (ZEPBOUND ) 12.5 MG/0.5ML Pen Inject 12.5 mg into the skin once a week. 2 mL 0   tirzepatide  (ZEPBOUND ) 15 MG/0.5ML Pen Inject 15 mg into the skin once a week. 2 mL 0   tirzepatide  (ZEPBOUND ) 5 MG/0.5ML Pen Inject 5 mg into the skin once a week. 2 mL 2   tirzepatide  (ZEPBOUND ) 7.5 MG/0.5ML Pen Inject 7.5 mg into the skin once a week. 2 mL 0   Facility-Administered Medications Prior to Visit  Medication Dose Route Frequency Provider Last Rate Last Admin   0.9 %  sodium chloride  infusion  500 mL Intravenous Once Lajuan Pila, MD       Past Medical History:  Diagnosis Date   Blood in stool    a few times a year. never evaluated.    Enlarged lymph nodes in armpit 02/07/2023   February 17, 2023 MyChart comments:   XR abnormal->02/17/23 CT scan results: found no lymphadenopathy      02/07/2023      Really noticed  in the past year  Flares sometimes, other times feels like nothing and not very sensitive. Other times its hard to even put his arm down its so sensitive.  Mostly in past year, prior to that he would occasionally feel something in the axilla.  For the past year its be   Hyperlipidemia    Past Surgical History:  Procedure Laterality Date   WISDOM TOOTH EXTRACTION     had anesthesia   Allergies  Allergen Reactions   Penicillins Hives      Objective:    Physical  Exam Vitals and nursing note reviewed.  Constitutional:      General: He is not in acute distress.    Appearance: Normal appearance.  HENT:     Head: Normocephalic.  Cardiovascular:     Rate and Rhythm: Normal rate and regular rhythm.  Pulmonary:     Effort: Pulmonary effort is normal.     Breath sounds: Normal breath sounds.  Musculoskeletal:        General: Normal range of motion.     Cervical back: Normal range of motion.  Skin:    General: Skin is warm and dry.  Neurological:     Mental Status: He is alert and oriented to person, place, and time.  Psychiatric:        Mood and Affect: Mood normal.   There were no vitals taken for this visit. Wt Readings from Last 3 Encounters:  10/22/23 228 lb (103.4 kg)  07/29/23 228 lb (103.4 kg)  03/29/23 228 lb (103.4 kg)       Versa Gore, NP

## 2023-12-23 NOTE — Progress Notes (Signed)
 E-Visit for Urinary Problems  Based on what you shared with me, I feel your condition warrants further evaluation and I recommend that you be seen for a face to face office visit.  Male bladder infections are not very common.  We worry about prostate or kidney conditions.  The standard of care is to examine the abdomen and kidneys, and to do a urine and blood test to make sure that something more serious is not going on.  We recommend that you see a provider today.  If your doctor's office is closed [facility] has the following Urgent Cares:   NOTE: There will be NO CHARGE for this E-Visit   If you are having a true medical emergency, please call 911.     For an urgent face to face visit, [facility] has multiple urgent care centers for your convenience.  Click the link below for the full list of locations and hours, walk-in wait times, appointment scheduling options and driving directions:  Urgent Care - [place], [place], [place], [place], [place], [place]  [place]     Your MyChart E-visit questionnaire answers were reviewed by a board certified advanced clinical practitioner to complete your personal care plan based on your specific symptoms.  Thank you for using e-Visits.        I have spent 5 minutes in review of e-visit questionnaire, review and updating patient chart, medical decision making and response to patient.   Angelia Kelp, PA-C

## 2023-12-24 ENCOUNTER — Other Ambulatory Visit (HOSPITAL_BASED_OUTPATIENT_CLINIC_OR_DEPARTMENT_OTHER): Payer: Self-pay

## 2023-12-24 ENCOUNTER — Encounter: Payer: Self-pay | Admitting: Internal Medicine

## 2023-12-24 DIAGNOSIS — N200 Calculus of kidney: Secondary | ICD-10-CM

## 2023-12-24 LAB — URINE CYTOLOGY ANCILLARY ONLY
Chlamydia: NEGATIVE
Comment: NEGATIVE
Comment: NEGATIVE
Comment: NORMAL
Neisseria Gonorrhea: NEGATIVE
Trichomonas: NEGATIVE

## 2023-12-25 ENCOUNTER — Other Ambulatory Visit

## 2023-12-25 DIAGNOSIS — N200 Calculus of kidney: Secondary | ICD-10-CM

## 2024-01-03 LAB — STONE ANALYSIS: Stone Weight: 0.067 g

## 2024-01-05 ENCOUNTER — Encounter: Payer: Self-pay | Admitting: Internal Medicine

## 2024-01-07 NOTE — Telephone Encounter (Signed)
 read by Kallie Orem "Rob" at 1:29PM on 01/05/2024

## 2024-01-21 ENCOUNTER — Other Ambulatory Visit (HOSPITAL_BASED_OUTPATIENT_CLINIC_OR_DEPARTMENT_OTHER): Payer: Self-pay

## 2024-02-10 ENCOUNTER — Encounter: Payer: Self-pay | Admitting: Internal Medicine

## 2024-02-10 ENCOUNTER — Ambulatory Visit (INDEPENDENT_AMBULATORY_CARE_PROVIDER_SITE_OTHER): Payer: Commercial Managed Care - PPO | Admitting: Internal Medicine

## 2024-02-10 VITALS — BP 120/70 | HR 81 | Temp 98.0°F | Ht 70.0 in | Wt 232.0 lb

## 2024-02-10 DIAGNOSIS — E8881 Metabolic syndrome: Secondary | ICD-10-CM

## 2024-02-10 DIAGNOSIS — K5730 Diverticulosis of large intestine without perforation or abscess without bleeding: Secondary | ICD-10-CM | POA: Diagnosis not present

## 2024-02-10 DIAGNOSIS — Z0000 Encounter for general adult medical examination without abnormal findings: Secondary | ICD-10-CM | POA: Diagnosis not present

## 2024-02-10 DIAGNOSIS — T7840XD Allergy, unspecified, subsequent encounter: Secondary | ICD-10-CM

## 2024-02-10 LAB — CBC WITH DIFFERENTIAL/PLATELET
Basophils Absolute: 0.1 10*3/uL (ref 0.0–0.1)
Basophils Relative: 1.4 % (ref 0.0–3.0)
Eosinophils Absolute: 0.1 10*3/uL (ref 0.0–0.7)
Eosinophils Relative: 1.5 % (ref 0.0–5.0)
HCT: 43.5 % (ref 39.0–52.0)
Hemoglobin: 15 g/dL (ref 13.0–17.0)
Lymphocytes Relative: 24.9 % (ref 12.0–46.0)
Lymphs Abs: 1.6 10*3/uL (ref 0.7–4.0)
MCHC: 34.5 g/dL (ref 30.0–36.0)
MCV: 85.3 fl (ref 78.0–100.0)
Monocytes Absolute: 0.5 10*3/uL (ref 0.1–1.0)
Monocytes Relative: 7.9 % (ref 3.0–12.0)
Neutro Abs: 4 10*3/uL (ref 1.4–7.7)
Neutrophils Relative %: 64.3 % (ref 43.0–77.0)
Platelets: 295 10*3/uL (ref 150.0–400.0)
RBC: 5.09 Mil/uL (ref 4.22–5.81)
RDW: 13.6 % (ref 11.5–15.5)
WBC: 6.2 10*3/uL (ref 4.0–10.5)

## 2024-02-10 LAB — COMPREHENSIVE METABOLIC PANEL WITH GFR
ALT: 25 U/L (ref 0–53)
AST: 22 U/L (ref 0–37)
Albumin: 4.5 g/dL (ref 3.5–5.2)
Alkaline Phosphatase: 65 U/L (ref 39–117)
BUN: 12 mg/dL (ref 6–23)
CO2: 26 meq/L (ref 19–32)
Calcium: 9.3 mg/dL (ref 8.4–10.5)
Chloride: 107 meq/L (ref 96–112)
Creatinine, Ser: 0.68 mg/dL (ref 0.40–1.50)
GFR: 116.51 mL/min (ref >60.00)
Glucose, Bld: 95 mg/dL (ref 70–99)
Potassium: 3.9 meq/L (ref 3.5–5.1)
Sodium: 141 meq/L (ref 135–145)
Total Bilirubin: 0.8 mg/dL (ref 0.2–1.2)
Total Protein: 6.7 g/dL (ref 6.0–8.3)

## 2024-02-10 NOTE — Patient Instructions (Addendum)
 ALLERGY MANAGEMENT PLAN  This plan is designed to help manage your allergic rhinitis (nasal allergies) effectively. Follow these steps daily for best results.  Sinus saline sprays- use nightly, and after sneezing episodes or exposure to allergen.  Insert deeply and spray mist into nose while leaning over sink at 45 degrees,  while gently breathing. Also blow out onto tissue while leaning forward 45 degrees. Once daily, after a sinus rinse, use sensimist.  Just before bedtime is best. This only needed if allergies acting up.  If this is inadequate add-on once daily for levocetirizine / xyzal 5 mg for nondrowsy antihistamine Take benadryl 25 mg at bedtime also if allergic mucus is persisting  When allergies cause chronic swelling in sinuses, it leads to sinus infections:    DAILY TREATMENT ROUTINE   Time of Day Treatment Steps  Morning 1. Saline Nasal Spray - Use to cleanse nasal passages 2. Xyzal (levocetirizine) - Take one tablet daily   Throughout Day Saline Nasal Spray - Use 2 additional times (mid-day and afternoon)   Evening/Bedtime 1. Saline Nasal Rinse - Thoroughly clean nasal passages 2. Flonase  Sensimist - Apply after nasal rinse 3. Benadryl (diphenhydramine) - Take 25mg  if experiencing persistent congestion    PROPER TECHNIQUE GUIDE       Saline Nasal Spray/Rinse Technique: Lean forward over sink at a 45-degree angle Turn head slightly to one side Insert spray tip into upper nostril Spray gently while breathing lightly through your nose Repeat on other side Gently blow nose to clear excess solution Use saline spray 3 times daily to keep nasal passages moist and clear allergens.       Flonase  Sensimist Technique: Shake bottle gently before each use Prime the bottle if it's new or hasn't been used for a week Tilt your head forward slightly Insert tip into nostril, pointing away from the center of your nose Spray while inhaling gently Repeat in other nostril Use  Flonase  Sensimist once daily, preferably at bedtime after using saline rinse. It may take several days of regular use to feel maximum benefit.   WHY FLONASE  SENSIMIST?   Benefits of Flonase  Sensimist:  Alcohol-free and scent-free formula - gentler on sensitive nasal passages Fine mist application - more comfortable with less dripping down throat Effectively relieves nasal congestion, sneezing, runny nose, and even eye symptoms 24-hour relief with once-daily dosing Uses a more potent form of fluticasone  that works at a lower dose Less liquid per spray means less discomfort  UNDERSTANDING YOUR MEDICATIONS   Medication How It Works Important Notes  Flonase  Sensimist (fluticasone  furoate) Reduces inflammation in nasal passages, addressing the underlying cause of allergy symptoms - Takes several days for full effect - Use daily for best results - Safe for long-term use   Xyzal (levocetirizine) Blocks histamine to reduce allergy symptoms like sneezing and itching - Take at the same time each day - May cause drowsiness in some people - Once-daily dosing   Benadryl (diphenhydramine) Antihistamine that provides additional relief for breakthrough symptoms - Causes drowsiness - Use only at bedtime - For occasional use when needed   Saline Spray/Rinse Physically removes allergens and moistens nasal passages - Safe to use frequently - Improves effectiveness of other treatments - Reduces nasal irritation    CONTACT YOUR PROVIDER IF: Your symptoms do not improve after 1-2 weeks of following this plan You develop sinus pain with fever or green/yellow discharge You experience frequent nosebleeds You develop new or worsening symptoms You have questions about your treatment plan  ADDITIONAL ALLERGY MANAGEMENT TIPS   HELPFUL STRATEGIES: ?? Keep windows closed during high pollen seasons ??? Use allergen-proof covers for pillows and mattresses ?? Vacuum regularly with a HEPA filter  vacuum ?? Shower and change clothes after spending time outdoors ?? Check local pollen counts and limit outdoor time when counts are high ?? Stay well-hydrated to help keep mucous membranes moist         ?? Trans Fats: What You Need to Know (and How to Avoid Them) Protect Your Heart, Brain, and Overall Health  ? What Are Trans Fats? Trans fats are a type of unhealthy fat that can increase your risk of: Heart disease Stroke Type 2 diabetes Inflammation Memory problems They are artificially made through a process called hydrogenation and were once common in processed foods for better shelf life and texture.  ?? Why Should I Avoid Trans Fats? Even small amounts of trans fats can: Raise "bad" LDL cholesterol Lower "good" HDL cholesterol Cause inflammation in your blood vessels Increase your risk of heart attack or stroke There is no safe level of artificial trans fat.  ?? How to Spot Trans Fats (Even When the Label Says "0g") Food companies can legally say "0 grams trans fat" if the product contains less than 0.5 grams per serving -- but that can add up fast! Look at the ingredients list for these clues: ?? Partially hydrogenated oil ? this means trans fat is present. ? Avoid foods with "shortening" or "hydrogenated" oils.  ?? Common Foods That May Contain Trans Fats Even today, you may find trans fats in: Baked goods (cookies, cakes, pies) Microwave popcorn Crackers Margarine and shortening Fried fast foods Frozen pizza  ? Healthier Choices Choose products with 0g trans fat and no "partially hydrogenated oil" in the ingredients. Use olive oil, avocado oil, or canola oil for cooking. Eat more whole, unprocessed foods: fruits, vegetables, whole grains, and lean proteins. Choose baked over fried, and fresh over packaged.  ?? Takeaway Message Trans fats are harmful, even in small amounts. To protect your health: Read labels carefully. Look beyond "0g trans fat" and  scan for "partially hydrogenated oils." Choose whole foods and heart-healthy fats.

## 2024-02-10 NOTE — Assessment & Plan Note (Signed)
 Diverticula in the colon were identified during a colonoscopy in April, likely due to inadequate fiber intake. Emphasized the importance of fiber in preventing complications such as diverticular bleeding or perforation, which can be life-threatening. Continue fiber supplementation with fiber gummies and increase dietary fiber intake, particularly from vegetables.

## 2024-02-10 NOTE — Assessment & Plan Note (Signed)
 Resolved hypersensitivity reaction to deodorant after discontinuation.

## 2024-02-10 NOTE — Progress Notes (Signed)
 [facility] [address] [place], [place] [address] Office:  [phone]  -- Annual Preventive Medical Office Visit --  Patient:  Spencer Phillips      Age: 38 y.o.       Sex:  male  Date:   02/10/2024 Patient Care Team: Anthon Kins, MD as PCP - General (Internal Medicine) Today's Healthcare Provider: Anthon Kins, MD  ========================================= Chief complaint: Annual Exam (Pt is present for cpe pt is not fasting )  Purpose of Visit: Comprehensive preventive health assessment and personalized health maintenance planning.  This encounter was conducted as a Comprehensive Physical Exam (CPE) preventive care annual visit. The patient's medical history and problem list were reviewed to inform individualized preventive care recommendations.   No problem-specific medical treatment was provided during this visit.     Assessment & Plan Encounter for annual health examination General Health Maintenance   He adheres to recommended screenings. Discussed sinus rinsing to prevent infections due to pollen exposure in [place]. Advised on using a smartwatch for sleep monitoring. Emphasized avoiding trans fats and reducing sugar intake to prevent cardiovascular disease. Use saline sinus rinses regularly, utilize smartwatch for sleep monitoring, avoid trans fats, and reduce sugar intake. Perform basic lab work with a direct cholesterol test. Diverticula of colon Diverticula in the colon were identified during a colonoscopy in April, likely due to inadequate fiber intake. Emphasized the importance of fiber in preventing complications such as diverticular bleeding or perforation, which can be life-threatening. Continue fiber supplementation with fiber gummies and increase dietary fiber intake, particularly from vegetables. Metabolic syndrome He exhibits signs of metabolic syndrome, including central obesity. Discussed risks such as increased  insulin resistance and cardiovascular events. Emphasized dietary changes and exercise to reduce waistline and improve metabolic health. Recommended resistance training to build muscle and reduce abdominal fat. Increase vegetable intake to improve diet quality and engage in regular physical activity, focusing on resistance training. Hypersensitivity reaction, subsequent encounter Resolved hypersensitivity reaction to deodorant after discontinuation.  Diagnoses and all orders for this visit: Encounter for annual health examination -     Comprehensive metabolic panel with GFR -     CBC with Differential/Platelet -     TSH Rfx on Abnormal to Free T4 -     Lipid Panel w/reflex Direct LDL Diverticula of colon Metabolic syndrome Hypersensitivity reaction, subsequent encounter   Reviewed/updated/encouraged completion: Immunization History  Administered Date(s) Administered   PFIZER(Purple Top)SARS-COV-2 Vaccination 11/02/2019, 11/30/2019   Tdap 04/04/2017   There are no preventive care reminders to display for this patient. Health Maintenance  Topic Date Due   COVID-19 Vaccine (3 - Pfizer risk series) 02/26/2024 (Originally 12/28/2019)   INFLUENZA VACCINE  03/27/2024   DTaP/Tdap/Td (2 - Td or Tdap) 04/05/2027   HIV Screening  Completed   HPV VACCINES  Aged Out   Meningococcal B Vaccine  Aged Out   Hepatitis C Screening  Discontinued    Reviewed the following verbally with patient and provided AVS materials:  HEALTH MAINTENANCE COUNSELING AND ANTICIPATORY GUIDANCE   Preventive Measure Recommendation  Eye Exams Every 1-2 years  Dental Care Cleanings every 6 months or more, brush/floss 3x daily  Sinus Care Saline spray rinses daily  Sleep 8 hours nightly, good sleep hygiene, e-monitoring if any daytime drowsiness  Diet Fruits/vegetables/fiber/healthy fats, balance and moderation  Exercise 150 minutes weekly  Risk Behaviors Discouraged any/all high risk behaviors   CANCER SCREENING  SHARED DECISION MAKING   Penile/Testicle/Scrotum Encouraged self-monitoring  and reporting of genital abnormalities. Patient reports none.  Thyroid  Checked and advised to palpate thyroid  for nodules  Prostate Individualized risks/benefits/costs discussed No results found for: PSA   Colon HM Colonoscopy   Patient reports having colonoscopy April. 2025 found only hemorrhoids for gastrointestinal bleeding  he does fiber gummies.   Lung Current guidelines recommend individuals aged 77 to 43 who currently smoke or formerly smoked and have a >= 20 pack-year smoking history should undergo annual screening with low-dose computed tomography (LDCT). Tobacco Use: Low Risk  (02/10/2024)   Patient History    Smoking Tobacco Use: Never    Smokeless Tobacco Use: Never    Passive Exposure: Not on file   Social History   Tobacco Use  Smoking Status Never  Smokeless Tobacco Never    Skin Advised regular sunscreen use. Patient denies worrisome, changing, or new skin lesions. Offered to include images in chart for surveillance. Showed patient these pictures of melanomas for reference to educate for self-monitoring.  Other Cancers Discussed lack of screening guidelines and insurance coverage for other cancer types.     Discussed the use of AI scribe software for clinical note transcription with the patient, who gave verbal consent to proceed.  History of Present Illness  38 year old male who presents for an annual physical exam.  He underwent a colonoscopy in April, which revealed hemorrhoids, small polyps, and diverticula, but no cancer. He has been using fiber gummies to manage these conditions.  He resides in [place], a location known for high pollen levels, but has no major allergy issues. His wife experiences significant allergy symptoms.  He has no issues with restful sleep and does not currently monitor his sleep despite owning a device capable of doing so.  His diet lacks vegetables,  partly due to his wife's aversion to them. He wants to increase his vegetable intake but finds it challenging to prepare vegetable dishes for himself alone.  No high-risk behaviors such as smoking, drug use, or extreme sports. No lumps or bumps and no family history of prostate or colon cancer.  He previously experienced swollen spots in his armpits, which resolved after discontinuing deodorant use, suggesting a hypersensitivity reaction.  ROS A comprehensive ROS was negative for any concerning symptoms.   Completed medication reconciliation: Current Outpatient Medications on File Prior to Visit  Medication Sig   rosuvastatin  (CRESTOR ) 20 MG tablet Take 1 tablet (20 mg total) by mouth daily.   Current Facility-Administered Medications on File Prior to Visit  Medication   0.9 %  sodium chloride  infusion   Medications Discontinued During This Encounter  Medication Reason   tamsulosin  (FLOMAX ) 0.4 MG CAPS capsule Completed Course  The following were reviewed and/or entered/updated into our electronic MEDICAL RECORD NUMBERPast Medical History:  Diagnosis Date   Blood in stool    a few times a year. never evaluated.    Enlarged lymph nodes in armpit 02/07/2023   February 17, 2023 MyChart comments:   XR abnormal->02/17/23 CT scan results: found no lymphadenopathy      02/07/2023      Really noticed in the past year  Flares sometimes, other times feels like nothing and not very sensitive. Other times its hard to even put his arm down its so sensitive.  Mostly in past year, prior to that he would occasionally feel something in the axilla.  For the past year its be   Hyperlipidemia    Past Surgical History:  Procedure Laterality Date   WISDOM TOOTH  EXTRACTION     had anesthesia   Social History   Socioeconomic History   Marital status: Married    Spouse name: Not on file   Number of children: 2   Years of education: Not on file   Highest education level: Bachelor's degree (e.g., BA, AB, BS)   Occupational History   Occupation: Production designer, theatre/television/film  Tobacco Use   Smoking status: Never   Smokeless tobacco: Never  Vaping Use   Vaping status: Never Used  Substance and Sexual Activity   Alcohol use: Yes    Alcohol/week: 5.0 standard drinks of alcohol    Types: 5 Standard drinks or equivalent per week    Comment: socially   Drug use: No   Sexual activity: Yes    Birth control/protection: Surgical    Comment: Vasectomy  Other Topics Concern   Not on file  Social History Narrative   Married to [place], PT ([facility]) since 5991. 54 year old son- Orlinda Blackbird - in 2018.       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    Philmore Bream Arvella Latina- studied Primary school teacher      Hobbies: football fan ([employer], some [employer] games), does some fantasy football, family time   Social Drivers of Corporate investment banker Strain: Low Risk  (12/23/2023)   Overall Financial Resource Strain (CARDIA)    Difficulty of Paying Living Expenses: Not hard at all  Food Insecurity: No Food Insecurity (12/23/2023)   Hunger Vital Sign    Worried About Running Out of Food in the Last Year: Never true    Ran Out of Food in the Last Year: Never true  Transportation Needs: No Transportation Needs (12/23/2023)   PRAPARE - Administrator, Civil Service (Medical): No    Lack of Transportation (Non-Medical): No  Physical Activity: Insufficiently Active (12/23/2023)   Exercise Vital Sign    Days of Exercise per Week: 2 days    Minutes of Exercise per Session: 30 min  Stress: No Stress Concern Present (12/23/2023)   [employer] of Occupational Health - Occupational Stress Questionnaire    Feeling of Stress : Not at all  Social Connections: Moderately Isolated (12/23/2023)   Social Connection and Isolation Panel    Frequency of Communication with Friends and Family: More than three times a week     Frequency of Social Gatherings with Friends and Family: More than three times a week    Attends Religious Services: Never    Database administrator or Organizations: No    Attends Banker Meetings: Not on file    Marital Status: Married  Intimate Partner Violence: Not on file      12/23/2023    9:48 AM  Alcohol Use Disorder Test (AUDIT)  1. How often do you have a drink containing alcohol? 3  2. How many drinks containing alcohol do you have on a typical day when you are drinking? 0  3. How often do you have six or more drinks on one occasion? 1  AUDIT-C Score 4   4. How often during the last year have you found that you were not able to stop drinking once you had started? 0  5. How often during the last year have you failed to do what was normally expected from you because of drinking? 0  6. How often during the last year have you needed a first  drink in the morning to get yourself going after a heavy drinking session? 0  7. How often during the last year have you had a feeling of guilt of remorse after drinking? 0  8. How often during the last year have you been unable to remember what happened the night before because you had been drinking? 0  9. Have you or someone else been injured as a result of your drinking? 0  10. Has a relative or friend or a doctor or another health worker been concerned about your drinking or suggested you cut down? 0  Alcohol Use Disorder Identification Test Final Score (AUDIT) 4      Patient-reported   Family History  Problem Relation Age of Onset   Healthy Mother    Depression Father        on rx   Healthy Sister    Healthy Brother    Breast cancer Maternal Grandmother        late 50s   Stroke Maternal Grandfather        died 18, smoker earlier in life   Other Paternal Grandfather        hit by car   ADD / ADHD Son    Colon cancer Neg Hx    Stomach cancer Neg Hx    Esophageal cancer Neg Hx    Allergies  Allergen Reactions    Penicillins Hives   Social History   Substance and Sexual Activity  Sexual Activity Yes   Birth control/protection: Surgical   Comment: Vasectomy   Social History   Tobacco Use   Smoking status: Never   Smokeless tobacco: Never  Vaping Use   Vaping status: Never Used  Substance Use Topics   Alcohol use: Yes    Alcohol/week: 5.0 standard drinks of alcohol    Types: 5 Standard drinks or equivalent per week    Comment: socially   Drug use: No      02/10/2024    8:12 AM  Depression screen PHQ 2/9  Decreased Interest 0  Down, Depressed, Hopeless 0  PHQ - 2 Score 0  Altered sleeping 0  Tired, decreased energy 0  Change in appetite 0  Feeling bad or failure about yourself  0  Trouble concentrating 0  Moving slowly or fidgety/restless 0  Suicidal thoughts 0  PHQ-9 Score 0  Difficult doing work/chores Not difficult at all      02/10/2024    8:12 AM  Fall Risk   Falls in the past year? 0  Number falls in past yr: 0  Injury with Fall? 0  Risk for fall due to : No Fall Risks  Follow up Falls evaluation completed     BP 120/70   Pulse 81   Temp 98 F (36.7 C) (Temporal)   Ht 5' 10 (1.778 m)   Wt 232 lb (105.2 kg)   SpO2 98%   BMI 33.29 kg/m  BP Readings from Last 3 Encounters:  02/10/24 120/70  12/23/23 122/74  10/22/23 (!) 94/59   Wt Readings from Last 10 Encounters:  02/10/24 232 lb (105.2 kg)  12/23/23 134 lb 3.2 oz (60.9 kg)  10/22/23 228 lb (103.4 kg)  07/29/23 228 lb (103.4 kg)  03/29/23 228 lb (103.4 kg)  02/21/23 231 lb (104.8 kg)  02/07/23 234 lb 9.6 oz (106.4 kg)  03/02/20 232 lb 6.1 oz (105.4 kg)  04/04/17 230 lb 6.4 oz (104.5 kg)  Physical Exam  Physical Exam SKIN: No melanoma despite freckling. Extensive  lesions on back. Photographs Taken 02/10/2024 :      GEN: No acute distress, resting comfortably. HEENT: Tympanic membranes normal appearing bilaterally, oropharynx clear, no thyromegaly noted, no palpable lymphadenopathy or thyroid   nodules. CARDIOVASCULAR: S1 and S2 heart sounds with regular rate and rhythm, no murmurs appreciated. PULMONARY: Normal work of breathing, clear to auscultation bilaterally, no crackles, wheezes, or rhonchi. ABDOMEN: Soft, nontender, nondistended. MSK: No edema, cyanosis, or clubbing noted. SKIN: Warm, dry, no lesions of concern observed. NEUROLOGICAL: Cranial nerves II-XII grossly intact, strength 5/5 in upper and lower extremities, reflexes symmetric and intact bilaterally. PSYCH: Normal affect and thought content, pleasant and cooperative.      ======================================  IMPORTANT HEALTH REMINDERS: Report any new or changing skin lesions promptly Maintain recommended screening schedules Discuss any new family history of cancer at future visits Follow up on any new symptoms that persist more than two weeks      Notes:  This document was synthesized by artificial intelligence (Abridge) using HIPAA-compliant recording of the clinical interaction;   We discussed the use of AI scribe software for clinical note transcription with the patient, who gave verbal consent to proceed.    This encounter employed state-of-the-art, real-time, collaborative documentation. The patient was empowered to actively review and assist in updating their electronic medical record on a shared monitor, ensuring transparency and improving accuracy.    Prior to and at the beginning of Comprehensive Physical Exam (CPE) preventive care annual visit appointment types  we clarify to patients Our goal today is to focus on your preventive or annual Comprehensive Physical Exam (CPE) preventive care annual visit, which typically covers routine screenings and overall health maintenance. However, if you share any new or concerning symptoms--such as dizziness, passing out, severe pain, or anything else that may point to a more serious issue--we are both legally and ethically required to evaluate it. We cannot  simply overlook or ignore such concerns, even if you later decide you don't want to discuss them, because it could jeopardize your health.  If addressing a new concern takes us  beyond the scope of the preventive visit, we may need to bill separately for that portion of care. We understand financial considerations are important, and we're happy to discuss your options if something new comes up. However, we want to be clear that once you mention a potentially serious issue, we must investigate it; we can't ethically or legally exclude that from our records or our evaluation. Please let us  know all of your questions or worries. Together, we can decide how best to manage them and how to minimize any unexpected costs, but we want to keep you safe above all else.   This disclosure is mandated by professional ethics and legal obligations, as healthcare providers must address any substantial health concerns raised during any patient interaction and a comprehensive ROS is required by insurance companies for billing preventive-care visit type.   This disclosure ultimately discourages patients financially from reporting significant health issues.

## 2024-02-11 LAB — LIPID PANEL W/REFLEX DIRECT LDL
Cholesterol: 146 mg/dL (ref <200)
HDL: 56 mg/dL (ref >=40)
LDL Cholesterol (Calc): 73 mg/dL
Non-HDL Cholesterol (Calc): 90 mg/dL (ref <130)
Total CHOL/HDL Ratio: 2.6 (calc) (ref <5.0)
Triglycerides: 89 mg/dL (ref <150)

## 2024-02-11 LAB — TSH RFX ON ABNORMAL TO FREE T4: TSH: 1.38 u[IU]/mL (ref 0.450–4.500)

## 2024-02-12 ENCOUNTER — Ambulatory Visit: Payer: Self-pay | Admitting: Internal Medicine

## 2024-02-21 ENCOUNTER — Other Ambulatory Visit (HOSPITAL_BASED_OUTPATIENT_CLINIC_OR_DEPARTMENT_OTHER): Payer: Self-pay

## 2024-04-28 ENCOUNTER — Other Ambulatory Visit (HOSPITAL_BASED_OUTPATIENT_CLINIC_OR_DEPARTMENT_OTHER): Payer: Self-pay

## 2024-04-28 ENCOUNTER — Other Ambulatory Visit: Payer: Self-pay | Admitting: Internal Medicine

## 2024-04-28 DIAGNOSIS — E785 Hyperlipidemia, unspecified: Secondary | ICD-10-CM

## 2024-04-28 MED ORDER — ROSUVASTATIN CALCIUM 20 MG PO TABS
20.0000 mg | ORAL_TABLET | Freq: Every day | ORAL | 3 refills | Status: AC
  Filled 2024-04-28: qty 30, 30d supply, fill #0
  Filled 2024-05-23: qty 30, 30d supply, fill #1
  Filled 2024-06-27: qty 30, 30d supply, fill #2
  Filled 2024-08-16: qty 30, 30d supply, fill #3

## 2024-05-25 ENCOUNTER — Other Ambulatory Visit (HOSPITAL_BASED_OUTPATIENT_CLINIC_OR_DEPARTMENT_OTHER): Payer: Self-pay

## 2024-06-02 ENCOUNTER — Encounter: Payer: Self-pay | Admitting: Dermatology

## 2024-06-02 ENCOUNTER — Other Ambulatory Visit (HOSPITAL_BASED_OUTPATIENT_CLINIC_OR_DEPARTMENT_OTHER): Payer: Self-pay

## 2024-06-02 ENCOUNTER — Ambulatory Visit: Payer: Commercial Managed Care - PPO | Admitting: Dermatology

## 2024-06-02 VITALS — BP 133/85

## 2024-06-02 DIAGNOSIS — D485 Neoplasm of uncertain behavior of skin: Secondary | ICD-10-CM | POA: Diagnosis not present

## 2024-06-02 DIAGNOSIS — L814 Other melanin hyperpigmentation: Secondary | ICD-10-CM

## 2024-06-02 DIAGNOSIS — L578 Other skin changes due to chronic exposure to nonionizing radiation: Secondary | ICD-10-CM | POA: Diagnosis not present

## 2024-06-02 DIAGNOSIS — W908XXA Exposure to other nonionizing radiation, initial encounter: Secondary | ICD-10-CM | POA: Diagnosis not present

## 2024-06-02 DIAGNOSIS — L739 Follicular disorder, unspecified: Secondary | ICD-10-CM

## 2024-06-02 DIAGNOSIS — L821 Other seborrheic keratosis: Secondary | ICD-10-CM

## 2024-06-02 DIAGNOSIS — D225 Melanocytic nevi of trunk: Secondary | ICD-10-CM | POA: Diagnosis not present

## 2024-06-02 DIAGNOSIS — D1801 Hemangioma of skin and subcutaneous tissue: Secondary | ICD-10-CM

## 2024-06-02 DIAGNOSIS — D229 Melanocytic nevi, unspecified: Secondary | ICD-10-CM

## 2024-06-02 DIAGNOSIS — Z1283 Encounter for screening for malignant neoplasm of skin: Secondary | ICD-10-CM | POA: Diagnosis not present

## 2024-06-02 MED ORDER — CLOBETASOL PROPIONATE 0.05 % EX OINT
TOPICAL_OINTMENT | CUTANEOUS | 2 refills | Status: AC
Start: 2024-06-02 — End: ?
  Filled 2024-06-02: qty 60, 30d supply, fill #0

## 2024-06-02 NOTE — Patient Instructions (Addendum)

## 2024-06-02 NOTE — Progress Notes (Addendum)
 Total Body Skin Exam (TBSE) Visit   Subjective  Spencer Phillips is a 38 y.o. male ESTABLISHED PATIENT who presents for the following:  Total Body Skin Exam (TBSE)  Patient was last evaluated for TBSE on 06/03/23 .  Patient does have spots of concern to be evaluated on R forearm. He does apply sunscreen and/or wears protective coverings. No personal or family history of skin cancer.   The patient has spots, moles and lesions to be evaluated, some may be new or changing and the patient has concerns that these could be cancer.  The following portions of the chart were reviewed this encounter and updated as appropriate: medications, allergies, medical history  Review of Systems:  No other skin or systemic complaints except as noted in HPI or Assessment and Plan.  Objective  Well appearing patient in no apparent distress; mood and affect are within normal limits.  A full examination was performed including scalp, head, eyes, ears, nose, lips, neck, chest, axillae, abdomen, back, buttocks, bilateral upper extremities, bilateral lower extremities, hands, feet, fingers, toes, fingernails, and toenails. All findings within normal limits unless otherwise noted below.   Relevant physical exam findings are noted in the Assessment and Plan.  Central Mid Back 4cm irregular brown macule  Assessment & Plan   LENTIGINES, SEBORRHEIC KERATOSES, HEMANGIOMAS - Benign normal skin lesions - Benign-appearing - Call for any changes  BENIGN MELANOCYTIC NEVI - Tan-brown and/or pink-flesh-colored symmetric macules and papules - Benign appearing on exam today - Observation - Call clinic for new or changing moles - Recommend daily use of broad spectrum spf 30+ sunscreen to sun-exposed areas.   MILD ACTINIC DAMAGE - Chronic condition, secondary to cumulative UV/sun exposure - diffuse scaly erythematous macules with underlying dyspigmentation - Recommend daily broad spectrum sunscreen SPF 30+ to  sun-exposed areas, reapply every 2 hours as needed.  - Staying in the shade or wearing long sleeves, sun glasses (UVA+UVB protection) and wide brim hats (4-inch brim around the entire circumference of the hat) are also recommended for sun protection.  - Call for new or changing lesions.  FOLLICULITIS Exam: Perifollicular erythematous papules and pustules at R forearm  Treatment Plan: - Wash affected areas with BPO then follow up with a gentle body wash - Continue applying Mupirocin  when new spots appear - Will Rx clobetasol oint - apply to affected areas BID for 2 weeks on, 2 weeks on for inflammation   SKIN CANCER SCREENING PERFORMED TODAY.   NEOPLASM OF UNCERTAIN BEHAVIOR OF SKIN Central Mid Back Skin / nail biopsy Type of biopsy: tangential   Informed consent: discussed and consent obtained   Timeout: patient name, date of birth, surgical site, and procedure verified   Procedure prep:  Patient was prepped and draped in usual sterile fashion Prep type:  Isopropyl alcohol Anesthesia: the lesion was anesthetized in a standard fashion   Anesthetic:  1% lidocaine w/ epinephrine 1-100,000 buffered w/ 8.4% NaHCO3 Instrument used: DermaBlade   Hemostasis achieved with: aluminum chloride   Outcome: patient tolerated procedure well   Post-procedure details: sterile dressing applied and wound care instructions given   Dressing type: petrolatum gauze and bandage    Specimen 1 - Surgical pathology Differential Diagnosis: r/o DN  Check Margins: Yes SKIN EXAM FOR MALIGNANT NEOPLASM   MULTIPLE BENIGN MELANOCYTIC NEVI   CHERRY ANGIOMA   LENTIGINES   SEBORRHEIC KERATOSIS   ACTINIC SKIN DAMAGE   FOLLICULITIS   Return in about 1 year (around 06/02/2025) for TBSE.  Documentation: I have reviewed the above documentation for accuracy and completeness, and I agree with the above.  I, Shirron Maranda, CMA, am acting as scribe for [employer], DO.   Delon Lenis, DO

## 2024-06-03 LAB — SURGICAL PATHOLOGY

## 2024-06-04 ENCOUNTER — Ambulatory Visit: Payer: Self-pay | Admitting: Dermatology

## 2024-06-29 ENCOUNTER — Other Ambulatory Visit (HOSPITAL_BASED_OUTPATIENT_CLINIC_OR_DEPARTMENT_OTHER): Payer: Self-pay

## 2024-08-17 ENCOUNTER — Other Ambulatory Visit (HOSPITAL_BASED_OUTPATIENT_CLINIC_OR_DEPARTMENT_OTHER): Payer: Self-pay

## 2025-02-10 ENCOUNTER — Encounter: Admitting: Internal Medicine

## 2025-06-03 ENCOUNTER — Ambulatory Visit: Admitting: Dermatology
